# Patient Record
Sex: Female | Born: 1955 | Race: Black or African American | Hispanic: No | Marital: Single | Smoking: Never smoker
Health system: Southern US, Community
[De-identification: ages and names within clinical notes are randomized; demographics above are authoritative.]

## PROBLEM LIST (undated history)

## (undated) ENCOUNTER — Ambulatory Visit: Admission: EM | Payer: Medicare Other | Source: Home / Self Care

## (undated) DIAGNOSIS — E119 Type 2 diabetes mellitus without complications: Secondary | ICD-10-CM

## (undated) DIAGNOSIS — J45909 Unspecified asthma, uncomplicated: Secondary | ICD-10-CM

## (undated) DIAGNOSIS — I10 Essential (primary) hypertension: Secondary | ICD-10-CM

## (undated) DIAGNOSIS — E785 Hyperlipidemia, unspecified: Secondary | ICD-10-CM

## (undated) HISTORY — PX: TUBAL LIGATION: SHX77

## (undated) HISTORY — DX: Unspecified asthma, uncomplicated: J45.909

## (undated) HISTORY — DX: Type 2 diabetes mellitus without complications: E11.9

## (undated) HISTORY — DX: Hyperlipidemia, unspecified: E78.5

## (undated) HISTORY — DX: Essential (primary) hypertension: I10

---

## 2003-03-22 ENCOUNTER — Emergency Department (HOSPITAL_COMMUNITY): Admission: EM | Admit: 2003-03-22 | Discharge: 2003-03-22 | Payer: Self-pay | Admitting: Emergency Medicine

## 2007-07-22 ENCOUNTER — Ambulatory Visit: Payer: Self-pay | Admitting: Internal Medicine

## 2007-07-23 ENCOUNTER — Ambulatory Visit: Payer: Self-pay | Admitting: *Deleted

## 2007-08-05 ENCOUNTER — Ambulatory Visit: Payer: Self-pay | Admitting: Internal Medicine

## 2007-08-05 LAB — CONVERTED CEMR LAB
ALT: 11 units/L (ref 0–35)
BUN: 14 mg/dL (ref 6–23)
Basophils Absolute: 0 10*3/uL (ref 0.0–0.1)
CO2: 26 meq/L (ref 19–32)
Calcium: 9.6 mg/dL (ref 8.4–10.5)
Chloride: 102 meq/L (ref 96–112)
Cholesterol: 178 mg/dL (ref 0–200)
Creatinine, Ser: 0.79 mg/dL (ref 0.40–1.20)
Hemoglobin: 13.2 g/dL (ref 12.0–15.0)
Lymphocytes Relative: 34 % (ref 12–46)
Lymphs Abs: 1.9 10*3/uL (ref 0.7–4.0)
Monocytes Absolute: 0.6 10*3/uL (ref 0.1–1.0)
Monocytes Relative: 11 % (ref 3–12)
Neutro Abs: 3 10*3/uL (ref 1.7–7.7)
RBC: 5.03 M/uL (ref 3.87–5.11)
Total CHOL/HDL Ratio: 3.3

## 2007-08-19 ENCOUNTER — Ambulatory Visit (HOSPITAL_COMMUNITY): Admission: RE | Admit: 2007-08-19 | Discharge: 2007-08-19 | Payer: Self-pay | Admitting: Family Medicine

## 2007-10-03 ENCOUNTER — Encounter: Payer: Self-pay | Admitting: Family Medicine

## 2007-10-03 ENCOUNTER — Ambulatory Visit: Payer: Self-pay | Admitting: Internal Medicine

## 2008-04-06 ENCOUNTER — Emergency Department (HOSPITAL_COMMUNITY): Admission: EM | Admit: 2008-04-06 | Discharge: 2008-04-06 | Payer: Self-pay | Admitting: Emergency Medicine

## 2008-10-15 ENCOUNTER — Emergency Department (HOSPITAL_COMMUNITY): Admission: EM | Admit: 2008-10-15 | Discharge: 2008-10-15 | Payer: Self-pay | Admitting: Emergency Medicine

## 2008-11-26 IMAGING — MG MM DIGITAL SCREENING BILAT W/ CAD
4 series · 4 of 4 positions shown · non-contrast
Comparison: none

DG SCREEN MAMMOGRAM BILATERAL
Bilateral CC and MLO view(s) were taken.

DIGITAL SCREENING MAMMOGRAM WITH CAD:
The breast tissue is heterogeneously dense.  There is no dominant mass, architectural distortion or
calcification to suggest malignancy.

[R CC]
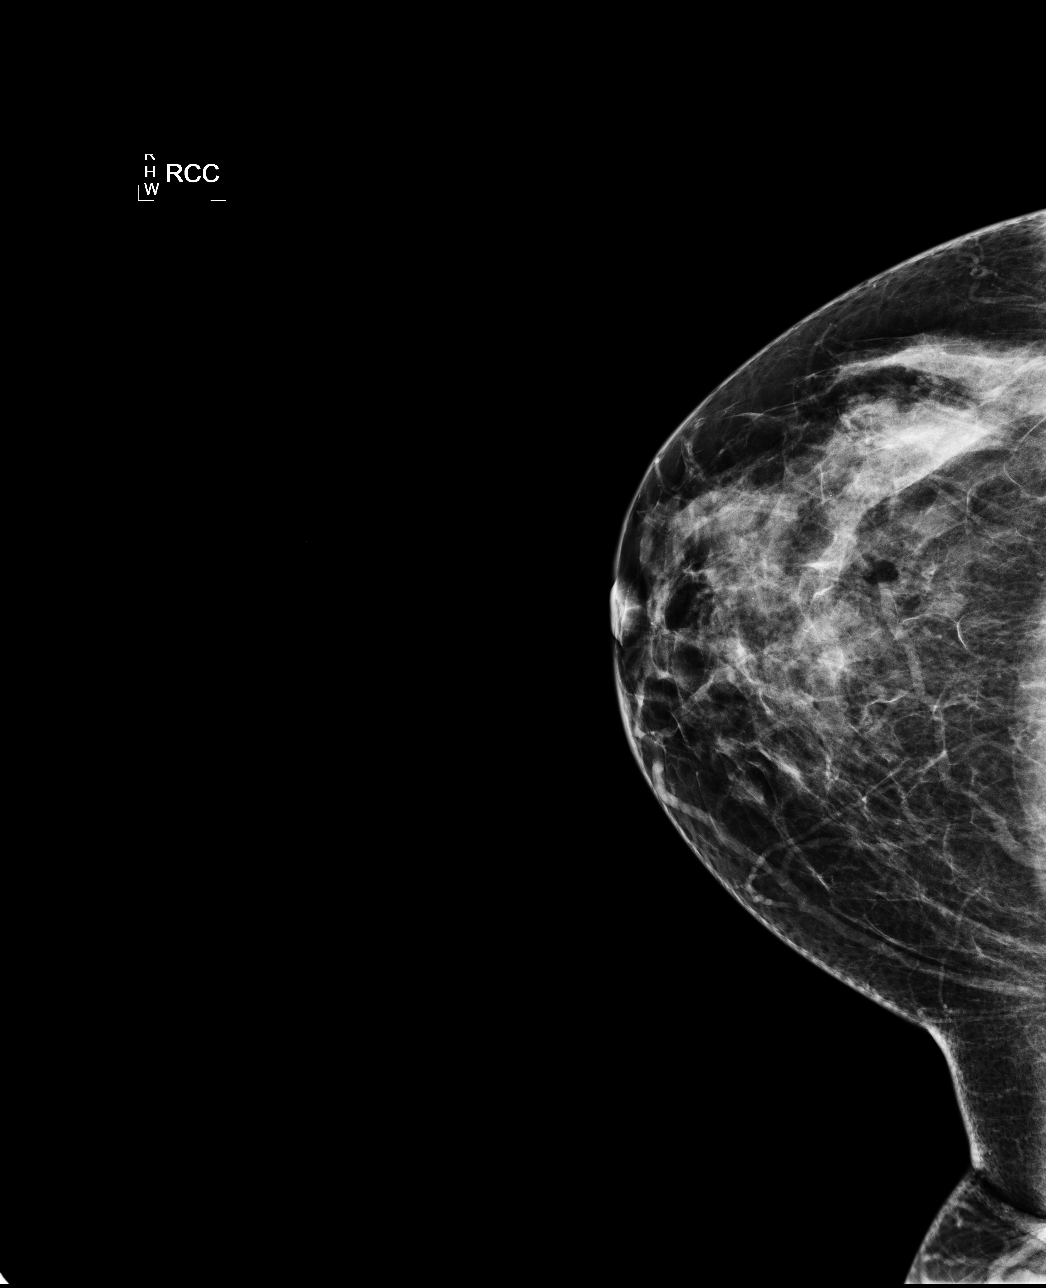

[R MLO]
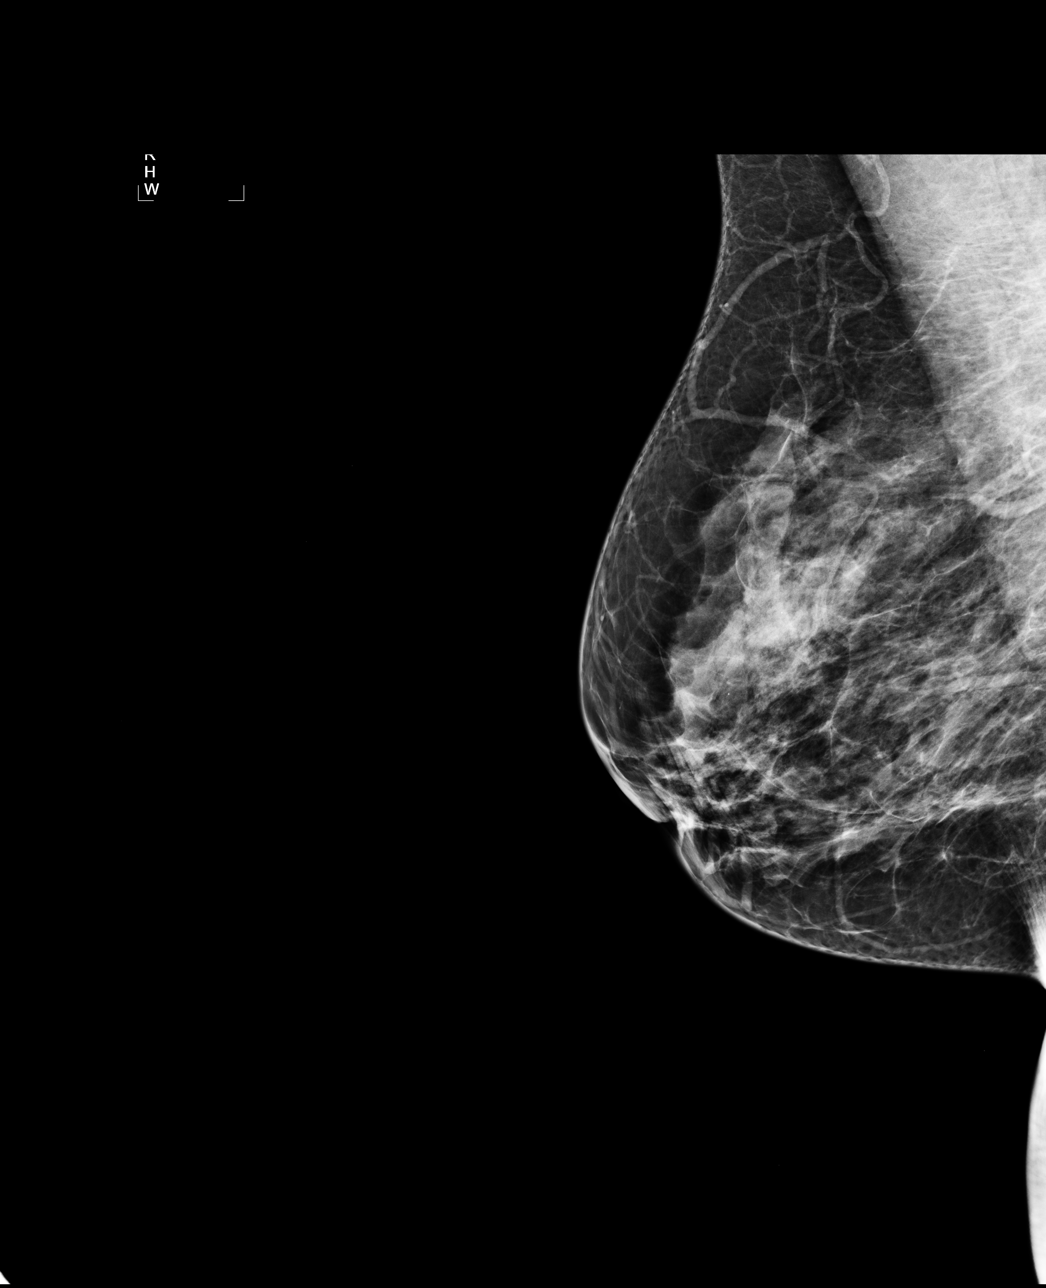

[L CC]
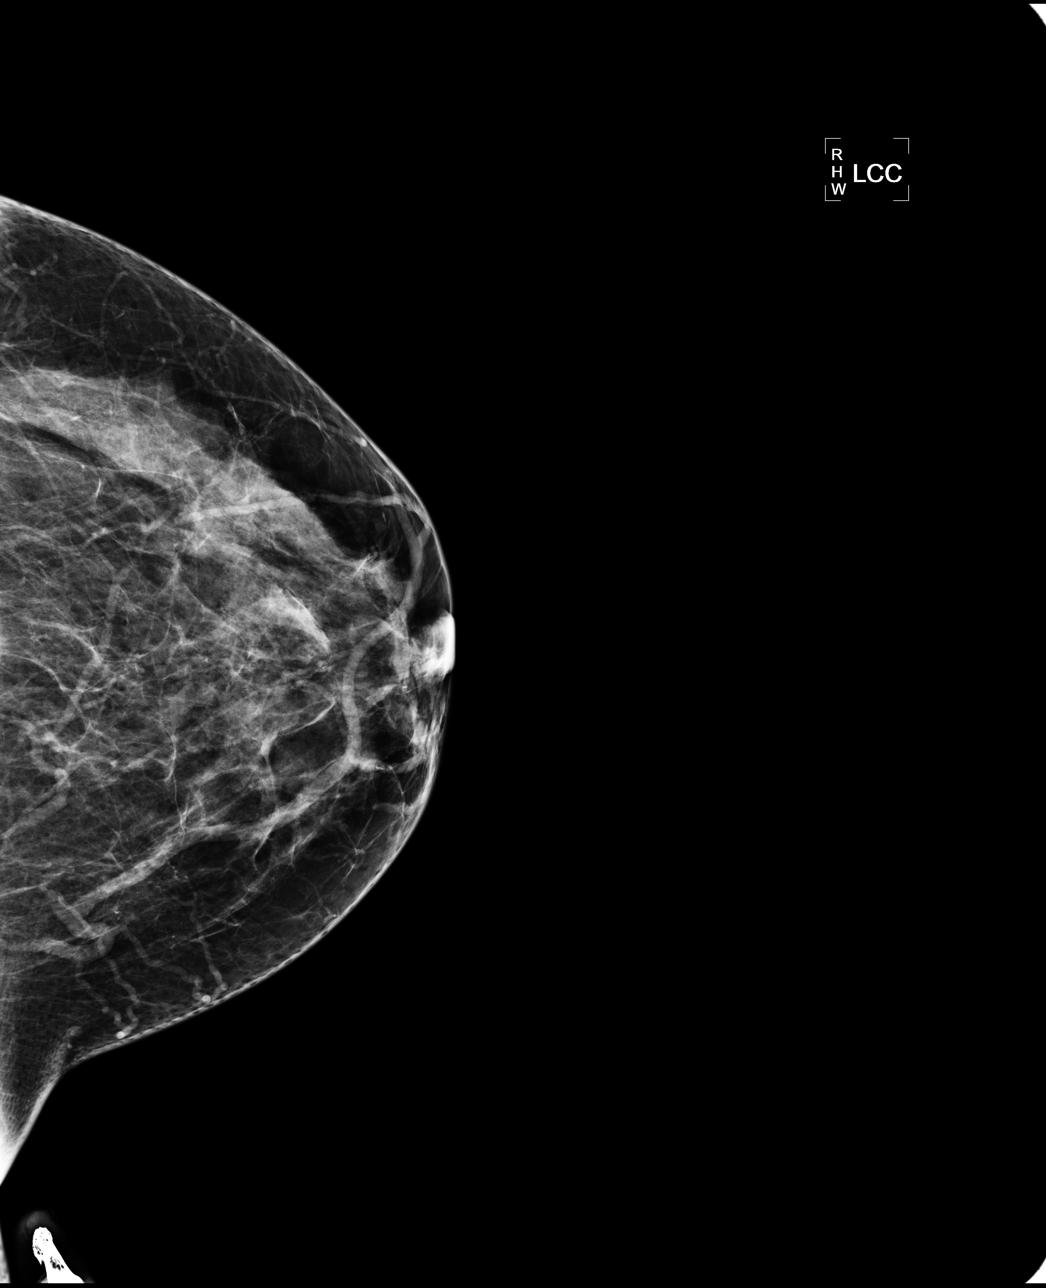

[L MLO]
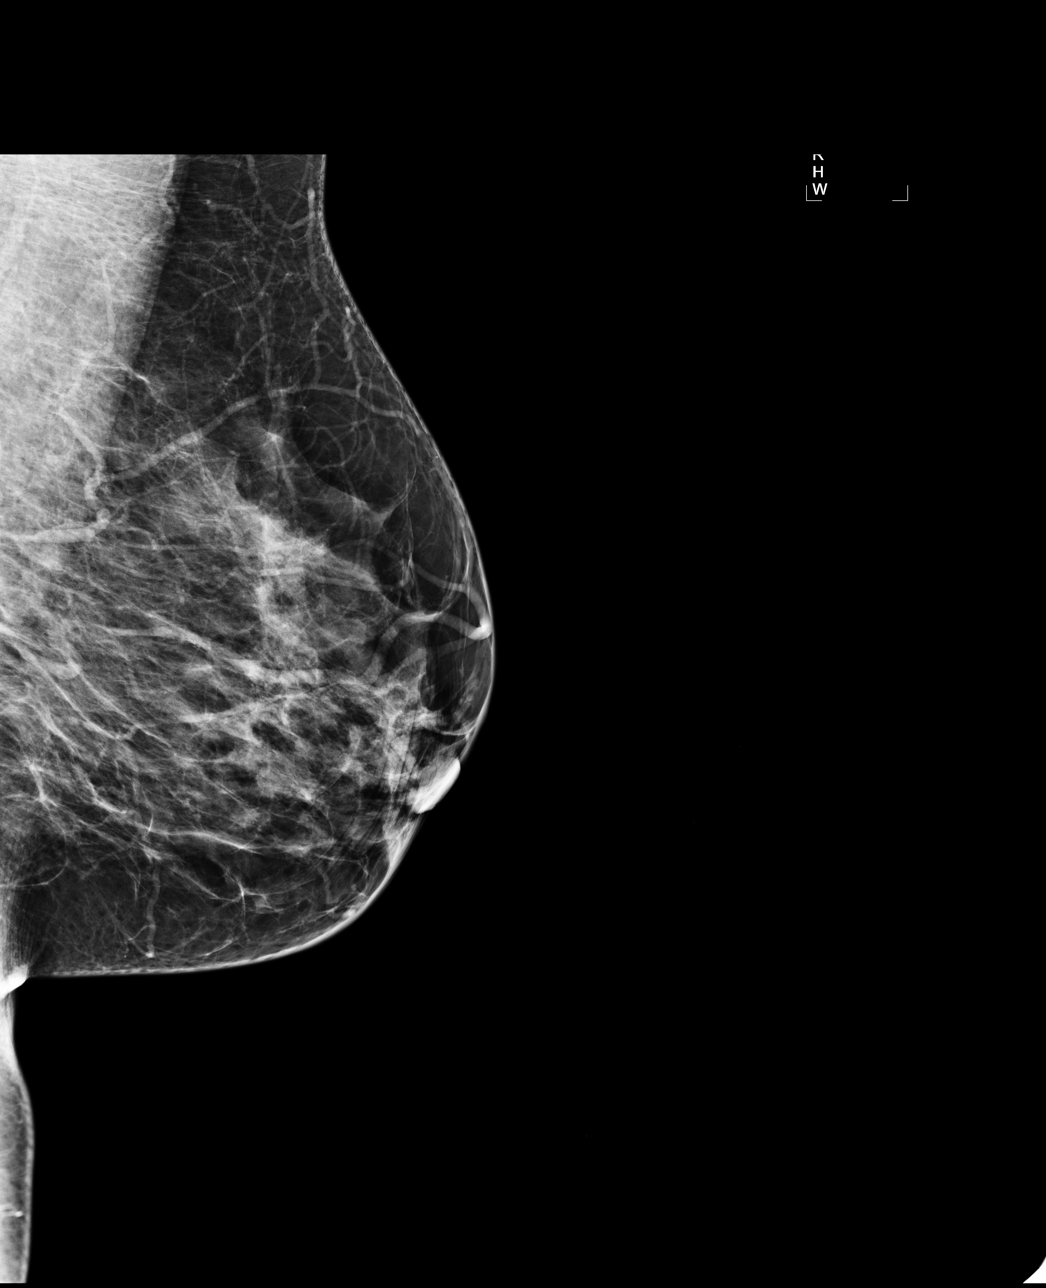

[4 of 4 positions shown; findings below may reference images not displayed]

IMPRESSION: No mammographic evidence of malignancy.  Suggest yearly screening mammography.

ASSESSMENT: Negative - BI-RADS 1

Screening mammogram in 1 year.
ANALYZED BY COMPUTER AIDED DETECTION. , THIS PROCEDURE WAS A DIGITAL MAMMOGRAM.

## 2011-05-06 ENCOUNTER — Emergency Department (HOSPITAL_COMMUNITY): Payer: Self-pay

## 2011-05-06 ENCOUNTER — Emergency Department (HOSPITAL_COMMUNITY)
Admission: EM | Admit: 2011-05-06 | Discharge: 2011-05-06 | Disposition: A | Discharge status: Home / Self Care | Payer: Self-pay | Attending: Emergency Medicine | Admitting: Emergency Medicine

## 2011-05-06 DIAGNOSIS — J40 Bronchitis, not specified as acute or chronic: Secondary | ICD-10-CM | POA: Insufficient documentation

## 2011-05-06 DIAGNOSIS — R0989 Other specified symptoms and signs involving the circulatory and respiratory systems: Secondary | ICD-10-CM | POA: Insufficient documentation

## 2011-05-06 DIAGNOSIS — R509 Fever, unspecified: Secondary | ICD-10-CM | POA: Insufficient documentation

## 2011-05-06 DIAGNOSIS — R0609 Other forms of dyspnea: Secondary | ICD-10-CM | POA: Insufficient documentation

## 2011-05-06 LAB — CBC
Hemoglobin: 11.5 g/dL — ABNORMAL LOW (ref 12.0–15.0)
MCHC: 33 g/dL (ref 30.0–36.0)
RBC: 4.29 MIL/uL (ref 3.87–5.11)
WBC: 6.1 10*3/uL (ref 4.0–10.5)

## 2011-05-06 LAB — BASIC METABOLIC PANEL
BUN: 10 mg/dL (ref 6–23)
Calcium: 9.9 mg/dL (ref 8.4–10.5)
Chloride: 105 mEq/L (ref 96–112)
Creatinine, Ser: 0.67 mg/dL (ref 0.50–1.10)
GFR calc Af Amer: >60 mL/min (ref >60)
GFR calc non Af Amer: >60 mL/min (ref >60)
Sodium: 140 mEq/L (ref 135–145)

## 2011-05-06 LAB — DIFFERENTIAL
Eosinophils Absolute: 0.3 10*3/uL (ref 0.0–0.7)
Lymphs Abs: 2 10*3/uL (ref 0.7–4.0)

## 2011-05-14 ENCOUNTER — Emergency Department (HOSPITAL_COMMUNITY)
Admission: EM | Admit: 2011-05-14 | Discharge: 2011-05-14 | Disposition: A | Discharge status: Home / Self Care | Payer: Self-pay | Attending: Emergency Medicine | Admitting: Emergency Medicine

## 2011-05-14 ENCOUNTER — Emergency Department (HOSPITAL_COMMUNITY): Payer: Self-pay

## 2011-05-14 DIAGNOSIS — R079 Chest pain, unspecified: Secondary | ICD-10-CM | POA: Insufficient documentation

## 2011-05-14 DIAGNOSIS — R05 Cough: Secondary | ICD-10-CM | POA: Insufficient documentation

## 2011-05-14 DIAGNOSIS — R059 Cough, unspecified: Secondary | ICD-10-CM | POA: Insufficient documentation

## 2011-05-14 DIAGNOSIS — J40 Bronchitis, not specified as acute or chronic: Secondary | ICD-10-CM | POA: Insufficient documentation

## 2011-06-15 LAB — URINE MICROSCOPIC-ADD ON

## 2011-06-15 LAB — CBC
HCT: 36.6
Hemoglobin: 12.1
MCV: 75.5 — ABNORMAL LOW
Platelets: 238
RDW: 15.3

## 2011-06-15 LAB — DIFFERENTIAL
Basophils Relative: 1
Eosinophils Absolute: 0.3
Lymphocytes Relative: 31
Monocytes Relative: 10
Neutro Abs: 3.7
Neutrophils Relative %: 54

## 2011-06-15 LAB — COMPREHENSIVE METABOLIC PANEL
AST: 19
CO2: 28
Chloride: 105
GFR calc Af Amer: >60
Potassium: 3.8
Total Bilirubin: 0.6

## 2011-06-15 LAB — URINALYSIS, ROUTINE W REFLEX MICROSCOPIC
Bilirubin Urine: NEGATIVE
Glucose, UA: NEGATIVE
Hgb urine dipstick: NEGATIVE

## 2011-06-15 LAB — POCT PREGNANCY, URINE: Preg Test, Ur: NEGATIVE

## 2012-08-13 IMAGING — CR DG CHEST 2V
2 series · 2 of 2 positions shown · non-contrast
Comparison: None.

CLINICAL DATA: Cough and fever for 1 week.

CHEST - 2 VIEW

[w chest pa]
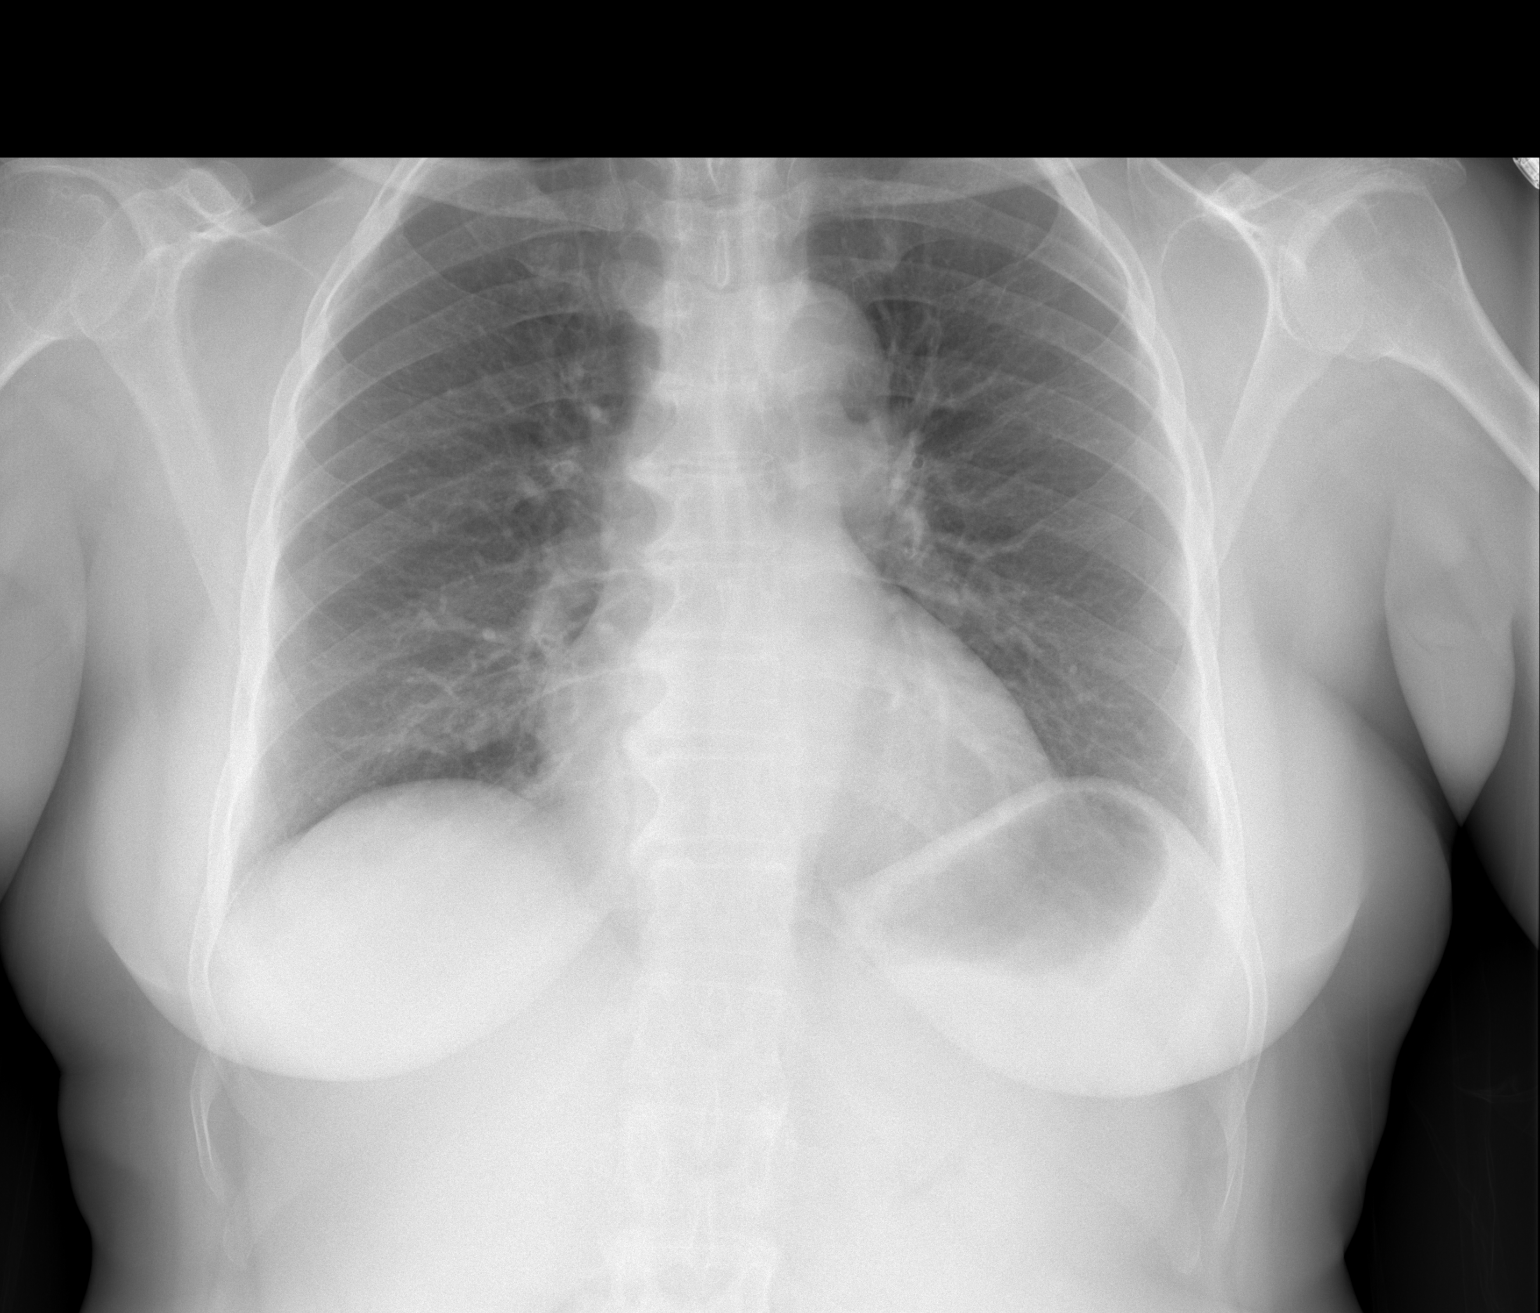

[w chest lat]
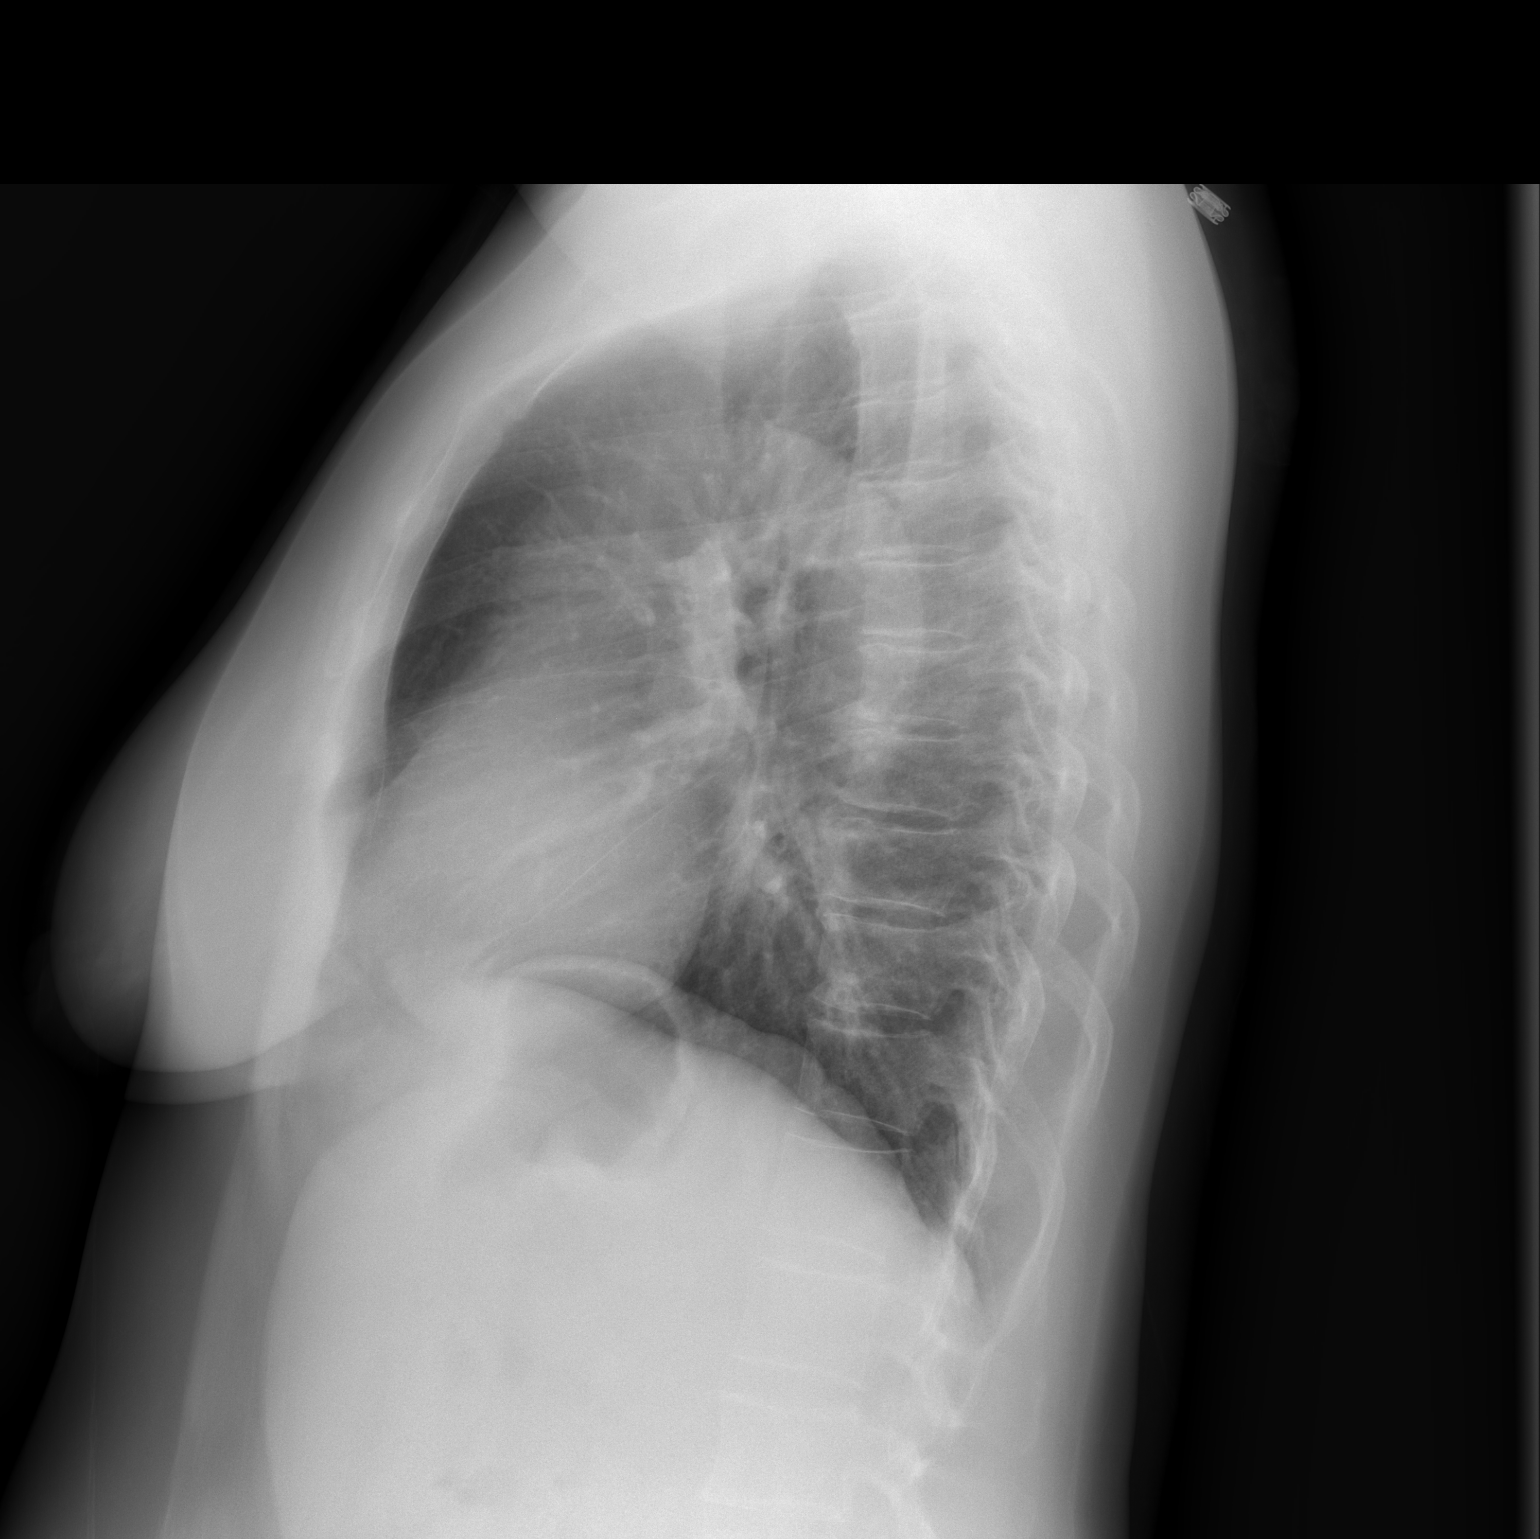

[2 of 2 positions shown; findings below may reference images not displayed]

FINDINGS: Heart size is normal.  There is perihilar peribronchial
thickening.  There are no focal consolidations or pleural
effusions.  No pulmonary edema.  Mild degenerative changes are
identified in the thoracic spine.
IMPRESSION: 1.  Bronchitic changes.
2. No focal pulmonary abnormality.

## 2012-08-21 IMAGING — CR DG CHEST 2V
2 series · 2 of 2 positions shown · non-contrast
Comparison: 05/06/2011.

CLINICAL DATA: Chest pain.

CHEST - 2 VIEW

[w chest lat]
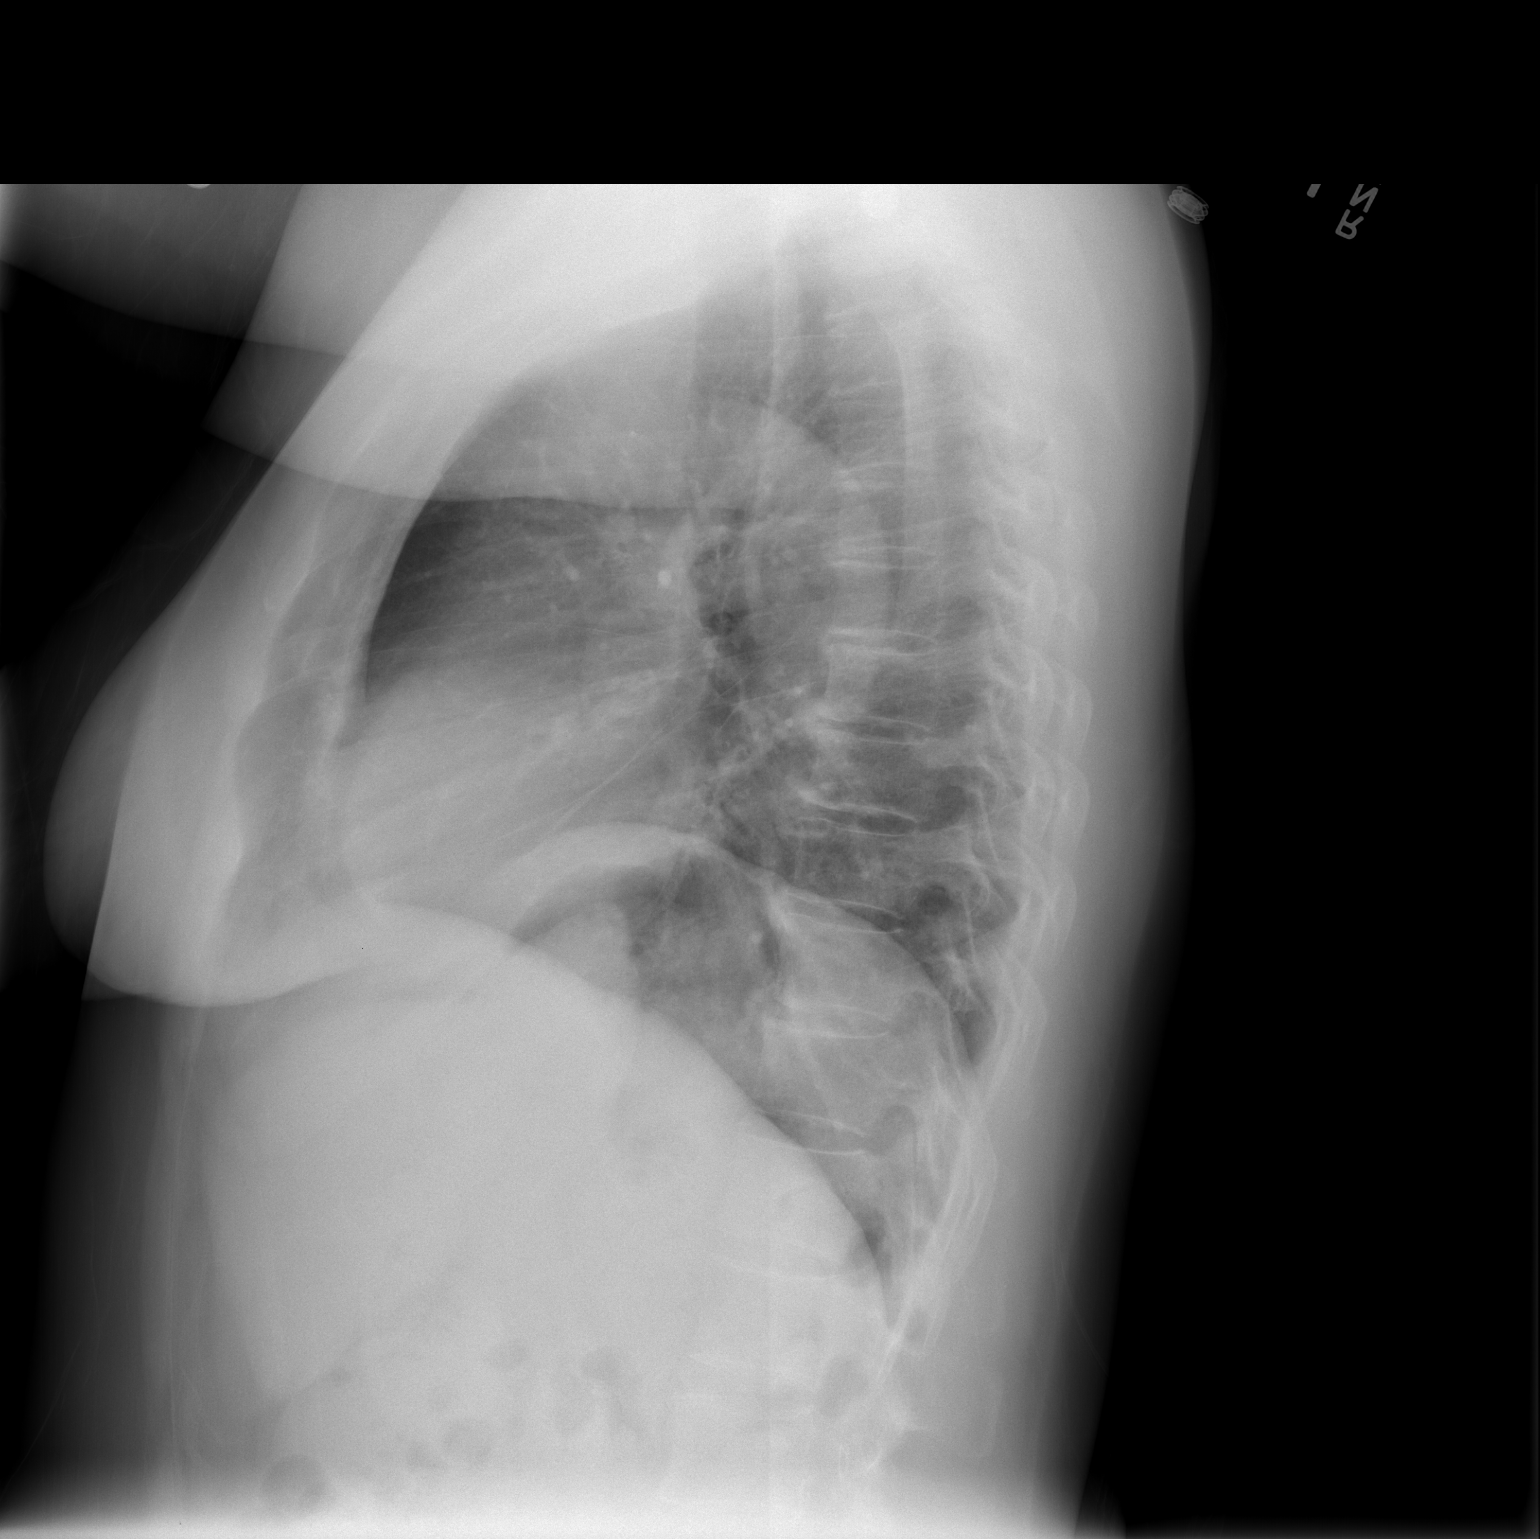

[w chest pa]
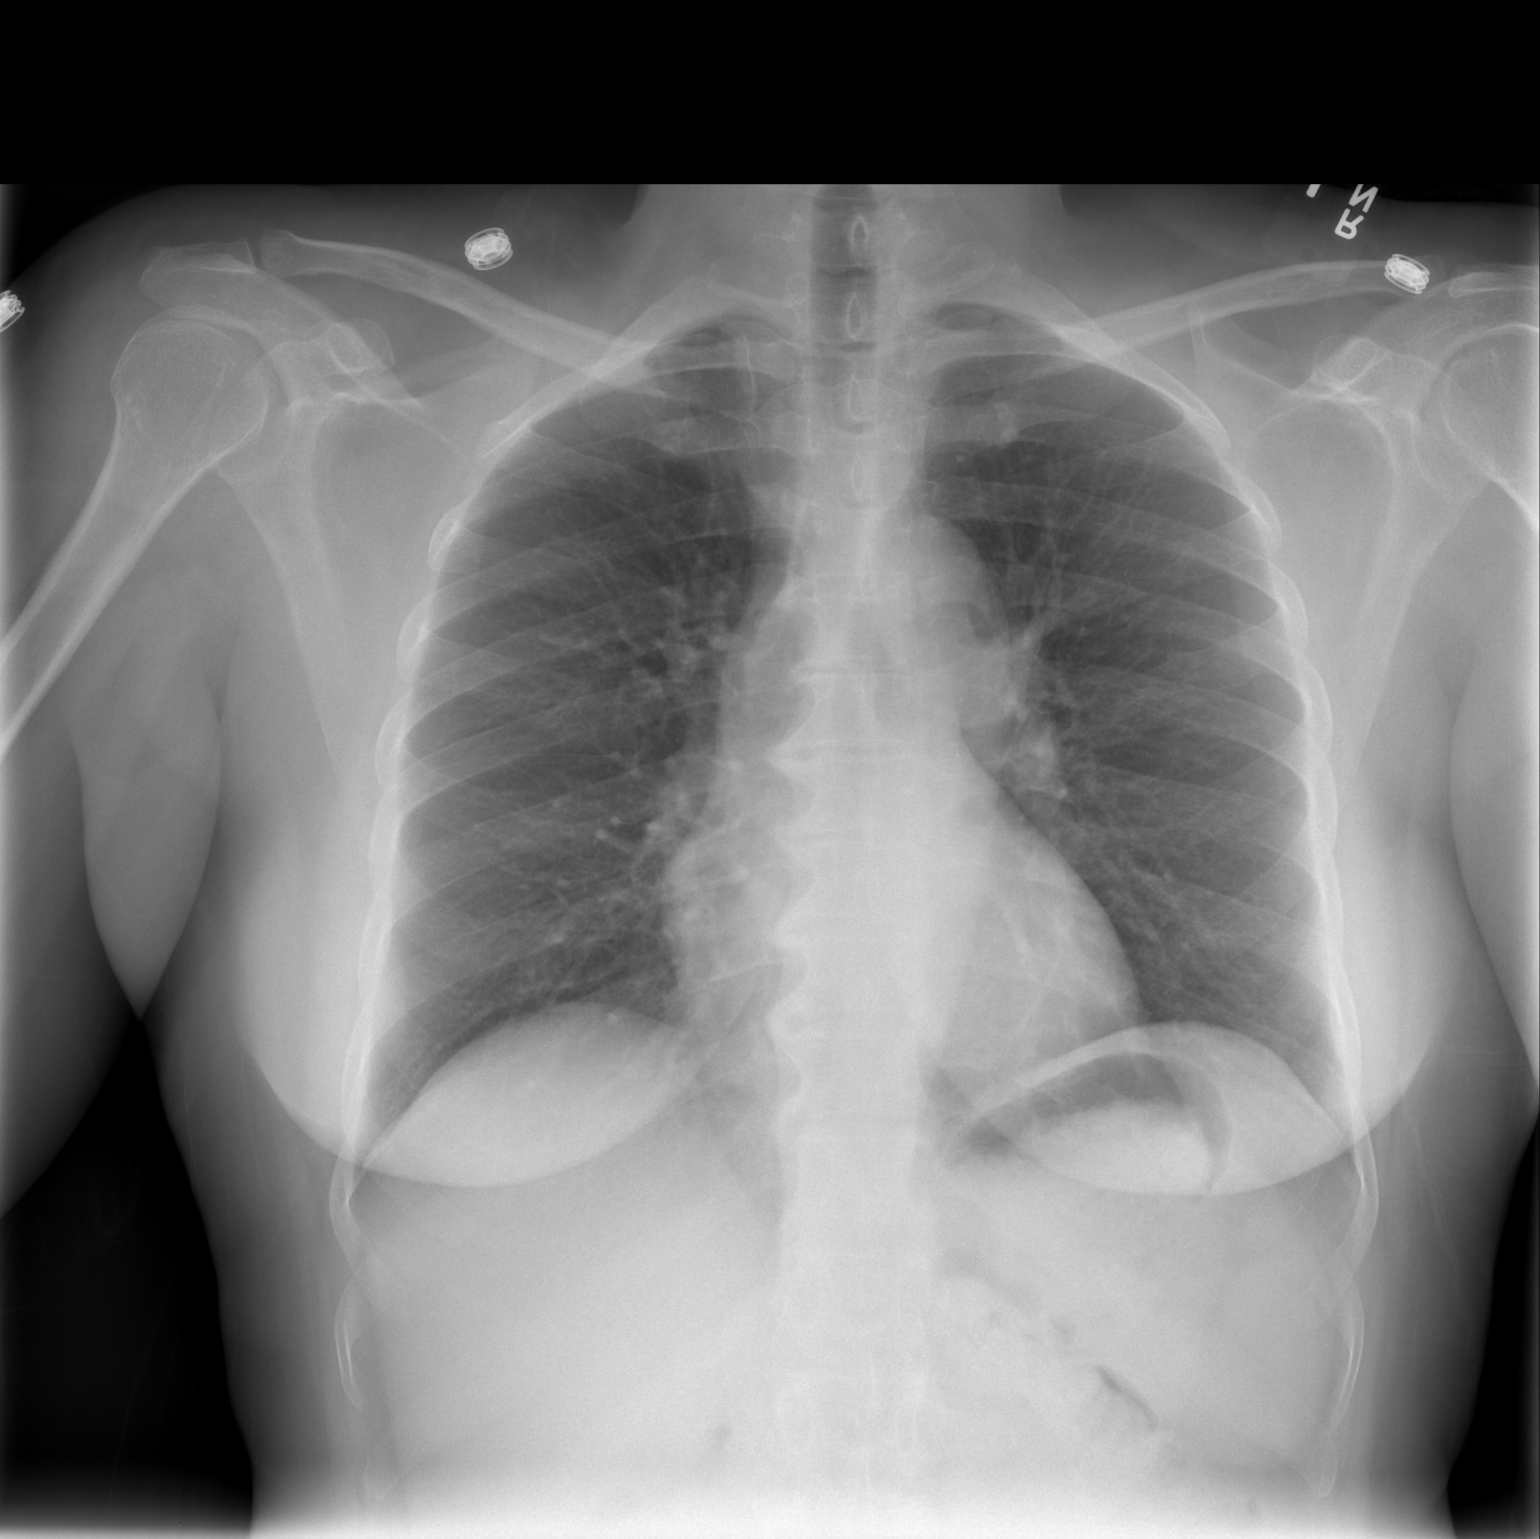

[2 of 2 positions shown; findings below may reference images not displayed]

FINDINGS: Trachea is midline.  Heart size normal.  Lungs are clear.
No pleural fluid.
IMPRESSION: No acute findings.

## 2018-08-29 ENCOUNTER — Institutional Professional Consult (permissible substitution): Payer: Self-pay | Admitting: Internal Medicine

## 2018-09-23 ENCOUNTER — Ambulatory Visit (INDEPENDENT_AMBULATORY_CARE_PROVIDER_SITE_OTHER)
Admission: RE | Admit: 2018-09-23 | Discharge: 2018-09-23 | Disposition: A | Discharge status: Home / Self Care | Payer: BLUE CROSS/BLUE SHIELD | Source: Ambulatory Visit | Attending: Internal Medicine | Admitting: Internal Medicine

## 2018-09-23 ENCOUNTER — Ambulatory Visit (INDEPENDENT_AMBULATORY_CARE_PROVIDER_SITE_OTHER): Payer: BLUE CROSS/BLUE SHIELD | Admitting: Internal Medicine

## 2018-09-23 ENCOUNTER — Encounter: Payer: Self-pay | Admitting: Internal Medicine

## 2018-09-23 DIAGNOSIS — J45991 Cough variant asthma: Secondary | ICD-10-CM | POA: Diagnosis not present

## 2018-09-23 LAB — NITRIC OXIDE: Nitric Oxide: 9

## 2018-09-23 MED ORDER — PREDNISONE 10 MG PO TABS: ORAL_TABLET | ORAL | 0 refills | Status: DC

## 2018-09-23 MED ORDER — FAMOTIDINE 20 MG PO TABS: ORAL_TABLET | ORAL | 11 refills | Status: AC

## 2018-09-23 MED ORDER — BENZONATATE 200 MG PO CAPS: 200.0000 mg | ORAL_CAPSULE | Freq: Three times a day (TID) | ORAL | 1 refills | Status: DC | PRN

## 2018-09-23 MED ORDER — PANTOPRAZOLE SODIUM 40 MG PO TBEC: 40.0000 mg | DELAYED_RELEASE_TABLET | Freq: Every day | ORAL | 2 refills | Status: DC

## 2018-09-23 NOTE — Patient Instructions (Addendum)
Prednisone 10 mg take  4 each am x 2 days,   2 each am x 2 days,  1 each am x 2 days and stop   GERD (REFLUX)  is an extremely common cause of respiratory symptoms just like yours , many times with no obvious heartburn at all.    It can be treated with medication, but also with lifestyle changes including elevation of the head of your bed (ideally with 6 -8inch blocks under the headboard of your bed),  Smoking cessation, avoidance of late meals, excessive alcohol, and avoid fatty foods, chocolate, peppermint, colas, red wine, and acidic juices such as orange juice.  NO MINT OR MENTHOL PRODUCTS SO NO COUGH DROPS  USE SUGARLESS CANDY INSTEAD (Jolley ranchers or Stover's or Life Savers) or even ice chips will also do - the key is to swallow to prevent all throat clearing. NO OIL BASED VITAMINS - use powdered substitutes.  Avoid fish oil when coughing.    For drainage / throat tickle try take CHLORPHENIRAMINE  4 mg = Chlortabs at [facility]-  - available over the counter- may cause drowsiness so start with just a bedtime dose or two and see if eliminates your night time cough   For cough > tessalon 200 mg up to every 8 hours as needed   Please remember to go to the  x-ray department at the [facility] building (directly across from [facility])  @ [address] [place]  in the basement  for your tests - we will call you with the results when they are available    Please schedule a follow up office visit in 4 weeks, sooner if needed  with all medications /inhalers/ solutions in hand so we can verify exactly what you are taking. This includes all medications from all doctors and over the counters Late add : instruction should have also included: Pantoprazole (protonix) 40 mg   Take  30-60 min before first meal of the day and Pepcid (famotidine)  20 mg one @  bedtime until return to office - this is the best way to tell whether stomach acid is contributing to your problem.

## 2018-09-23 NOTE — Progress Notes (Signed)
LMTCB

## 2018-09-23 NOTE — Progress Notes (Signed)
Clarice Poleminata Lawhorn, female    DOB: 10/15/1955,     MRN: [id number]   Brief patient profile:  [age] yobf never smoker  from [place]/ f[place] language/ no longer international travel works as nursing assist  No resp problems until onset of cough summer of 2018 and on and off since then  so referred to pulmonary clinic 09/23/2018 by Dr   Leanor KailBonsu    History of Present Illness  09/23/2018  Pulmonary/ 1st office eval/[facility]  Chief Complaint  Patient presents with  . Pulmonary Consult    Referred by Dr. Julio Sickssei Bonsu.  Pt c/o non prod cough for the past 18 months. Cold, rainy weather makes her cough worse.   Dyspnea:  Only with couging Cough: see below/ mostly dry / really only better when on cough medications like tussionex  Sleep: each noct and each am  SABA use: none     Kouffman Reflux v Neurogenic Cough Differentiator Reflux Comments  Do you awaken from a sound sleep coughing violently?                            With trouble breathing? Yes most nocts   Do you have choking episodes when you cannot  Get enough air, gasping for air ?              sometimes   Do you usually cough when you lie down into  The bed, or when you just lie down to rest ?                          No    Do you usually cough after meals or eating?         Cold water   Do you cough when (or after) you bend over?    no   GERD SCORE     Kouffman Reflux v Neurogenic Cough Differentiator Neurogenic   Do you more-or-less cough all day long? sporadic   Does change of temperature make you cough? yes   Does laughing or chuckling cause you to cough? no   Do fumes (perfume, automobile fumes, burned  Toast, etc.,) cause you to cough ?      perfume   Does speaking, singing, or talking on the phone cause you to cough   ?               None    Neurogenic/Airway score      No obvious other pattern in  day to day or daytime variability or assoc excess/ purulent sputum or mucus plugs or hemoptysis or cp or chest tightness, subjective  wheeze or overt sinus or hb symptoms.    Also denies any obvious fluctuation of symptoms with weather or environmental changes or other aggravating or alleviating factors except as outlined above   No unusual exposure hx or h/o childhood pna/ asthma or knowledge of premature birth.  Current Allergies, Complete Past Medical History, Past Surgical History, Family History, and Social History were reviewed in [facility] electronic medical record.  ROS  The following are not active complaints unless bolded Hoarseness, sore throat, dysphagia, dental problems, itching, sneezing,  nasal congestion or discharge of excess mucus or purulent secretions, ear ache,   fever, chills, sweats, unintended wt loss or wt gain, classically pleuritic or exertional cp,  orthopnea pnd or arm/hand swelling  or leg swelling, presyncope, palpitations, abdominal pain, anorexia, nausea, vomiting, diarrhea  or change in bowel habits or change in bladder habits, change in stools or change in urine, dysuria, hematuria,  rash, arthralgias, visual complaints, headache, numbness, weakness or ataxia or problems with walking or coordination,  change in mood or  memory.           No past medical history on file.  Outpatient Medications Prior to Visit  Medication Sig Dispense Refill  . amLODipine (NORVASC) 5 MG tablet Take 5 mg by mouth daily.    . rosuvastatin (CRESTOR) 20 MG tablet Take 20 mg by mouth daily.         Objective:     BP 118/68 (BP Location: Left Arm, Cuff Size: Normal)   Pulse (!) 51   Ht 5\' 6"  (1.676 m)   Wt 184 lb (83.5 kg)   SpO2 99%   BMI 29.70 kg/m   SpO2: 99 % RA  Very pleasant slt hoarse amb bf nad   HEENT: nl dentition, turbinates bilaterally, and oropharynx. Nl external ear canals without cough reflex   NECK :  without JVD/Nodes/TM/ nl carotid upstrokes bilaterally   LUNGS: no acc muscle use,  Nl contour chest which is clear to A and P bilaterally without cough on insp or exp  maneuvers   CV:  RRR  no s3 or murmur or increase in P2, and no edema   ABD:  soft and nontender with nl inspiratory excursion in the supine position. No bruits or organomegaly appreciated, bowel sounds nl  MS:  Nl gait/ ext warm without deformities, calf tenderness, cyanosis or clubbing No obvious joint restrictions   SKIN: warm and dry without lesions    NEURO:  alert, approp, nl sensorium with  no motor or cerebellar deficits apparent.      CXR PA and Lateral:   09/23/2018 :    I personally reviewed images and agree with radiology impression as follows:    1.  Cardiomegaly.  Normal pulmonary vascularity. 2.  No acute pulmonary disease. My review:  Min cardiomegaly      Assessment   Cough variant asthma vs UACS Onset summer of 2018  - Spirometry 09/23/2018  FEV1 2.2 (97%)  Ratio nl s curvature or prior rx - FENO 09/23/2018  =   9 - max rx for gerd and 1st gen H1 blockers per guidelines  For UACS 09/23/2018 >>>    The most common causes of chronic cough in immunocompetent adults include the following: upper airway cough syndrome (UACS), previously referred to as postnasal drip syndrome (PNDS), which is caused by variety of rhinosinus conditions; (2) asthma; (3) GERD; (4) chronic bronchitis from cigarette smoking or other inhaled environmental irritants; (5) nonasthmatic eosinophilic bronchitis; and (6) bronchiectasis.   These conditions, singly or in combination, have accounted for up to 94% of the causes of chronic cough in prospective studies.   Other conditions have constituted no >6% of the causes in prospective studies These have included bronchogenic carcinoma, chronic interstitial pneumonia, sarcoidosis, left ventricular failure, ACEI-induced cough, and aspiration from a condition associated with pharyngeal dysfunction.    Chronic cough is often simultaneously caused by more than one condition. A single cause has been found from 38 to 82% of the time, multiple causes from 18  to 62%. Multiply caused cough has been the result of three diseases up to 42% of the time.       Based on absence of assoc rhinitis/atopic hx and nl spirometry /feno,  Over asthma here I favor Upper airway cough syndrome (  previously labeled PNDS),  is so named because it's frequently impossible to sort out how much is  CR/sinusitis with freq throat clearing (which can be related to primary GERD)   vs  causing  secondary (" extra esophageal")  GERD from wide swings in gastric pressure that occur with throat clearing, often  promoting self use of mint and menthol lozenges that reduce the lower esophageal sphincter tone and exacerbate the problem further in a cyclical fashion.   These are the same pts (now being labeled as having "irritable larynx syndrome" by some cough centers) who not infrequently have a history of having failed to tolerate ace inhibitors,  dry powder inhalers or biphosphonates or report having atypical/extraesophageal reflux symptoms that don't respond to standard doses of PPI  and are easily confused as having aecopd or asthma flares by even experienced allergists/ pulmonologists (myself included).    Of the three most common causes of  Sub-acute / recurrent or chronic cough, only one (GERD)  can actually contribute to/ trigger  the other two (asthma and post nasal drip syndrome)  and perpetuate the cylce of cough.  While not intuitively obvious, many patients with chronic low grade reflux do not cough until there is a primary insult that disturbs the protective epithelial barrier and exposes sensitive nerve endings.   This is typically viral but can due to PNDS and  either may apply here.     >>>  The point is that once this occurs, it is difficult to eliminate the cycle  using anything but a maximally effective acid suppression regimen at least in the short run, accompanied by an appropriate diet to address non acid GERD and control / eliminate the cough itself with high doses of  tessalon (avoiding tussionex or opioids in general if possible) and eliminate pnds, esp at hs, with 1st gen H1 blockers per guidelines   Also given short course of prednisone for any T2 driven inflammatory component and if only transiently responds then may need allergy profile or even methacholine challenge to sort out.   Also reviewed: The standardized cough guidelines published in Chest by [place] in 2006 are still the best available and consist of a multiple step process (up to 12!) , not a single office visit,  and are intended  to address this problem logically,  with an alogrithm dependent on response to empiric treatment at  each progressive step  to determine a specific diagnosis with  minimal addtional testing needed. Therefore if adherence is an issue or can't be accurately verified,  it's very unlikely the standard evaluation and treatment will be successful here.    Furthermore, response to therapy (other than acute cough suppression, which should only be used short term with avoidance of narcotic containing cough syrups if possible), can be a gradual process for which the patient is not likely to  perceive immediate benefit.  Unlike going to an eye doctor where the best perscription is almost always the first one and is immediately effective, this is almost never the case in the management of chronic cough syndromes. Therefore the patient needs to commit up front to consistently adhere to recommendations  for up to 4-6 weeks of therapy directed at the likely underlying problem(s) before the response can be reasonably evaluated.   F/u in 4 weeks with all meds in hand using a trust but verify approach to confirm accurate Medication  Reconciliation The principal here is that until we are certain that the  patients are  doing what we've asked, it makes no sense to ask them to do more.        Total time devoted to counseling  > 50 % of initial 60 min office visit:  review case with pt/  discussion of options/alternatives/ personally creating written customized instructions  in presence of pt  then going over those specific  Instructions directly with the pt including how to use all of the meds but in particular covering each new medication in detail and the difference between the maintenance= "automatic" meds and the prns using an action plan format for the latter (If this problem/symptom => do that organization reading Left to right).  Please see AVS from this visit for a full list of these instructions which I personally wrote for this pt and  are unique to this visit.        Sandrea Hughs, MD 09/23/2018

## 2018-09-24 ENCOUNTER — Telehealth: Payer: Self-pay | Admitting: Internal Medicine

## 2018-09-24 ENCOUNTER — Telehealth: Payer: Self-pay | Admitting: *Deleted

## 2018-09-24 ENCOUNTER — Encounter: Payer: Self-pay | Admitting: Internal Medicine

## 2018-09-24 NOTE — Telephone Encounter (Signed)
-----   Message from Nyoka Cowden, MD sent at 09/24/2018  6:27 AM EST ----- Late add : instruction should have also included: Pantoprazole (protonix) 40 mg   Take  30-60 min before first meal of the day and Pepcid (famotidine)  20 mg one @  bedtime until return to office - this is the best way to tell whether stomach acid is contributing to your problem.  They were included on list of meds on her avs and called in already

## 2018-09-24 NOTE — Assessment & Plan Note (Signed)
Onset summer of 2018  - Spirometry 09/23/2018  FEV1 2.2 (97%)  Ratio nl s curvature or prior rx - FENO 09/23/2018  =   9 - max rx for gerd and 1st gen H1 blockers per guidelines  For UACS 09/23/2018 >>>   The most common causes of chronic cough in immunocompetent adults include the following: upper airway cough syndrome (UACS), previously referred to as postnasal drip syndrome (PNDS), which is caused by variety of rhinosinus conditions; (2) asthma; (3) GERD; (4) chronic bronchitis from cigarette smoking or other inhaled environmental irritants; (5) nonasthmatic eosinophilic bronchitis; and (6) bronchiectasis.   These conditions, singly or in combination, have accounted for up to 94% of the causes of chronic cough in prospective studies.   Other conditions have constituted no >6% of the causes in prospective studies These have included bronchogenic carcinoma, chronic interstitial pneumonia, sarcoidosis, left ventricular failure, ACEI-induced cough, and aspiration from a condition associated with pharyngeal dysfunction.    Chronic cough is often simultaneously caused by more than one condition. A single cause has been found from 38 to 82% of the time, multiple causes from 18 to 62%. Multiply caused cough has been the result of three diseases up to 42% of the time.       Based on absence of assoc rhinitis/atopic hx and nl spirometry /feno,  Over asthma here I favor Upper airway cough syndrome (previously labeled PNDS),  is so named because it's frequently impossible to sort out how much is  CR/sinusitis with freq throat clearing (which can be related to primary GERD)   vs  causing  secondary (" extra esophageal")  GERD from wide swings in gastric pressure that occur with throat clearing, often  promoting self use of mint and menthol lozenges that reduce the lower esophageal sphincter tone and exacerbate the problem further in a cyclical fashion.   These are the same pts (now being labeled as having  "irritable larynx syndrome" by some cough centers) who not infrequently have a history of having failed to tolerate ace inhibitors,  dry powder inhalers or biphosphonates or report having atypical/extraesophageal reflux symptoms that don't respond to standard doses of PPI  and are easily confused as having aecopd or asthma flares by even experienced allergists/ pulmonologists (myself included).    Of the three most common causes of  Sub-acute / recurrent or chronic cough, only one (GERD)  can actually contribute to/ trigger  the other two (asthma and post nasal drip syndrome)  and perpetuate the cylce of cough.  While not intuitively obvious, many patients with chronic low grade reflux do not cough until there is a primary insult that disturbs the protective epithelial barrier and exposes sensitive nerve endings.   This is typically viral but can due to PNDS and  either may apply here.     >>>  The point is that once this occurs, it is difficult to eliminate the cycle  using anything but a maximally effective acid suppression regimen at least in the short run, accompanied by an appropriate diet to address non acid GERD and control / eliminate the cough itself with high doses of tessalon (avoiding tussionex or opioids in general if possible) and eliminate pnds, esp at hs, with 1st gen H1 blockers per guidelines   Also given short course of prednisone for any T2 driven inflammatory component and if only transiently responds then may need allergy profile or even methacholine challenge to sort out.   Also reviewed: The standardized cough guidelines published  in Chest by [place] in 2006 are still the best available and consist of a multiple step process (up to 12!) , not a single office visit,  and are intended  to address this problem logically,  with an alogrithm dependent on response to empiric treatment at  each progressive step  to determine a specific diagnosis with  minimal addtional testing  needed. Therefore if adherence is an issue or can't be accurately verified,  it's very unlikely the standard evaluation and treatment will be successful here.    Furthermore, response to therapy (other than acute cough suppression, which should only be used short term with avoidance of narcotic containing cough syrups if possible), can be a gradual process for which the patient is not likely to  perceive immediate benefit.  Unlike going to an eye doctor where the best perscription is almost always the first one and is immediately effective, this is almost never the case in the management of chronic cough syndromes. Therefore the patient needs to commit up front to consistently adhere to recommendations  for up to 4-6 weeks of therapy directed at the likely underlying problem(s) before the response can be reasonably evaluated.   F/u in 4 weeks with all meds in hand using a trust but verify approach to confirm accurate Medication  Reconciliation The principal here is that until we are certain that the  patients are doing what we've asked, it makes no sense to ask them to do more.    Total time devoted to counseling  > 50 % of initial 60 min office visit:  review case with pt/ discussion of options/alternatives/ personally creating written customized instructions  in presence of pt  then going over those specific  Instructions directly with the pt including how to use all of the meds but in particular covering each new medication in detail and the difference between the maintenance= "automatic" meds and the prns using an action plan format for the latter (If this problem/symptom => do that organization reading Left to right).  Please see AVS from this visit for a full list of these instructions which I personally wrote for this pt and  are unique to this visit.

## 2018-09-24 NOTE — Telephone Encounter (Signed)
LMTCB.  Notes recorded by Nyoka Cowden, MD on 09/23/2018 at 5:15 PM EST Call pt: Reviewed cxr and no acute change so no change in recommendations made at ov.

## 2018-09-24 NOTE — Telephone Encounter (Signed)
ATC and phone rings multiple times and then msg states call can not be completed  WCB later

## 2018-09-25 NOTE — Telephone Encounter (Signed)
ATC, NA and no option to leave msg 

## 2018-09-25 NOTE — Telephone Encounter (Signed)
Attempted to call Patient.  Left message on VM to call back. 

## 2018-09-25 NOTE — Telephone Encounter (Signed)
ATC pt, no answer. Left message for pt to call back.  

## 2018-09-25 NOTE — Progress Notes (Signed)
ATC, NA and no VM available this time

## 2018-09-25 NOTE — Telephone Encounter (Signed)
Patient returning call, CB is [phone] °

## 2018-09-25 NOTE — Telephone Encounter (Signed)
Patient returning call, CB is [phone]

## 2018-09-26 NOTE — Telephone Encounter (Signed)
Pt notified of recs

## 2018-09-26 NOTE — Telephone Encounter (Signed)
Called and spoke with pt letting her know the results of cxr. Pt expressed understanding. Nothing further needed. 

## 2018-10-14 ENCOUNTER — Other Ambulatory Visit: Payer: Self-pay | Admitting: Internal Medicine

## 2018-10-14 DIAGNOSIS — J45991 Cough variant asthma: Secondary | ICD-10-CM

## 2018-10-19 ENCOUNTER — Other Ambulatory Visit: Payer: Self-pay | Admitting: Internal Medicine

## 2018-10-19 DIAGNOSIS — J45991 Cough variant asthma: Secondary | ICD-10-CM

## 2018-10-21 ENCOUNTER — Encounter: Payer: Self-pay | Admitting: Internal Medicine

## 2018-10-21 ENCOUNTER — Ambulatory Visit: Payer: BLUE CROSS/BLUE SHIELD | Admitting: Internal Medicine

## 2018-10-21 VITALS — BP 122/68 | HR 75 | Ht 66.0 in | Wt 183.0 lb

## 2018-10-21 DIAGNOSIS — J45991 Cough variant asthma: Secondary | ICD-10-CM | POA: Diagnosis not present

## 2018-10-21 NOTE — Patient Instructions (Addendum)
Pantoprazole (protonix) 40 mg   Take  30-60 min before first meal of the day and Pepcid (famotidine)  20 mg one @  bedtime until return to office - this is the best way to tell whether stomach acid is contributing to your problem.      GERD (REFLUX)  is an extremely common cause of respiratory symptoms just like yours , many times with no obvious heartburn at all.    It can be treated with medication, but also with lifestyle changes including elevation of the head of your bed (ideally with 6 -8inch blocks under the headboard of your bed),  Smoking cessation, avoidance of late meals, excessive alcohol, and avoid fatty foods, chocolate, peppermint, colas, red wine, and acidic juices such as orange juice.  NO MINT OR MENTHOL PRODUCTS SO NO COUGH DROPS  USE SUGARLESS CANDY INSTEAD (Jolley ranchers or Stover's or Life Savers) or even ice chips will also do - the key is to swallow to prevent all throat clearing. NO OIL BASED VITAMINS - use powdered substitutes.  Avoid fish oil when coughing.    For drainage / throat tickle try take CHLORPHENIRAMINE  4 mg = Chlortabs at [facility]-  - available over the counter- may cause drowsiness so start with just a bedtime dose or two and see if eliminates your night time cough   For cough > tessalon 200 mg up to every 8 hours as needed    Please schedule a follow up office visit in 4 weeks, sooner if needed

## 2018-10-21 NOTE — Progress Notes (Signed)
Gabriella Norman, female    DOB: 08/27/1956,     MRN: [id number]   Brief patient profile:  36 yobf never smoker  from [place]/ f[place] Primary language/ no longer international travel works as nursing assist  No resp problems until onset of cough summer of 2018 and on and off since then  so referred to pulmonary clinic 09/23/2018 by Dr   Leanor Kail    History of Present Illness  09/23/2018  Pulmonary/ 1st office eval/Alvilda Mckenna  Chief Complaint  Patient presents with  . Pulmonary Consult    Referred by Dr. Julio Sicks.  Pt c/o non prod cough for the past 18 months. Cold, rainy weather makes her cough worse.   Dyspnea:  Only with couging Cough: see below/ mostly dry / really only better when on cough medications like tussionex  Sleep: each noct and each am  SABA use: none rec Prednisone 10 mg take  4 each am x 2 days,   2 each am x 2 days,  1 each am x 2 days and stop  GERD diet  For drainage / throat tickle try take CHLORPHENIRAMINE  4 mg = Chlortabs at [facility]-    For cough > tessalon 200 mg up to every 8 hours as needed  Please remember to go to the  x-ray department    Pantoprazole (protonix) 40 mg   Take  30-60 min before first meal of the day and Pepcid (famotidine)  20 mg one @  bedtime until return to office - this is the best way to tell whether stomach acid is contributing to your problem.     10/21/2018  f/u ov/Irving Lubbers re: chronic cough/ did not try 1st gen H1 blockers per guidelines   Chief Complaint  Patient presents with  . Follow-up    Cough     Dyspnea:  Not limited by breathing from desired activities   Cough: first thing am but overall better  Sleeping: fine flat  SABA use: none  02: none    No obvious day to day or daytime variability or assoc excess/ purulent sputum or mucus plugs or hemoptysis or cp or chest tightness, subjective wheeze or overt sinus or hb symptoms.   Sleeping without nocturnal   exacerbation  of respiratory  c/o's or need for noct saba. Also denies any obvious  fluctuation of symptoms with weather or environmental changes or other aggravating or alleviating factors except as outlined above   No unusual exposure hx or h/o childhood pna/ asthma or knowledge of premature birth.  Current Allergies, Complete Past Medical History, Past Surgical History, Family History, and Social History were reviewed in [employer] record.  ROS  The following are not active complaints unless bolded Hoarseness, sore throat, dysphagia, dental problems, itching, sneezing,  nasal congestion or discharge of excess mucus or purulent secretions, ear ache,   fever, chills, sweats, unintended wt loss or wt gain, classically pleuritic or exertional cp,  orthopnea pnd or arm/hand swelling  or leg swelling, presyncope, palpitations, abdominal pain, anorexia, nausea, vomiting, diarrhea  or change in bowel habits or change in bladder habits, change in stools or change in urine, dysuria, hematuria,  rash, arthralgias, visual complaints, headache, numbness, weakness or ataxia or problems with walking or coordination,  change in mood or  memory.        Current Meds  Medication Sig  . amLODipine (NORVASC) 5 MG tablet Take 5 mg by mouth daily.  . benzonatate (TESSALON) 200 MG capsule Take 1  capsule (200 mg total) by mouth 3 (three) times daily as needed for cough.  . famotidine (PEPCID) 20 MG tablet One at bedtime  . pantoprazole (PROTONIX) 40 MG tablet Take 1 tablet (40 mg total) by mouth daily. Take 30-60 min before first meal of the day  . rosuvastatin (CRESTOR) 20 MG tablet Take 20 mg by mouth daily.           Objective:     Pleasant amb black female/ no longer hoarse  Wt Readings from Last 3 Encounters:  10/21/18 183 lb (83 kg)  09/23/18 184 lb (83.5 kg)     Vital signs reviewed - Note on arrival 02 sats  98% on RA      HEENT: nl dentition, turbinates bilaterally, and oropharynx. Nl external ear canals without cough reflex   NECK :  without  JVD/Nodes/TM/ nl carotid upstrokes bilaterally   LUNGS: no acc muscle use,  Nl contour chest which is clear to A and P bilaterally without cough on insp or exp maneuvers   CV:  RRR  no s3 or murmur or increase in P2, and no edema   ABD:  soft and nontender with nl inspiratory excursion in the supine position. No bruits or organomegaly appreciated, bowel sounds nl  MS:  Nl gait/ ext warm without deformities, calf tenderness, cyanosis or clubbing No obvious joint restrictions   SKIN: warm and dry without lesions    NEURO:  alert, approp, nl sensorium with  no motor or cerebellar deficits apparent.             Assessment

## 2018-10-22 ENCOUNTER — Encounter: Payer: Self-pay | Admitting: Internal Medicine

## 2018-10-22 NOTE — Assessment & Plan Note (Addendum)
Onset summer of 2018  - Spirometry 09/23/2018  FEV1 2.2 (97%)  Ratio nl s curvature or prior rx - FENO 09/23/2018  =   9 - max rx for gerd and 1st gen H1 blockers per guidelines  For UACS 09/23/2018 >>> did not take 1st gen H1 blockers per guidelines    rec 1st gen H1 blockers per guidelines  / continue gerd rx / diet/ hob elevation reviewed  Each maintenance medication was reviewed in detail including most importantly the difference between maintenance and as needed and under what circumstances the prns are to be used.  Please see AVS for specific  Instructions which are unique to this visit and I personally typed out  which were reviewed in detail in writing with the patient and a copy provided.    F/u in 4 weeks, sooner prn

## 2018-11-18 ENCOUNTER — Ambulatory Visit: Payer: BLUE CROSS/BLUE SHIELD | Admitting: Internal Medicine

## 2018-12-17 ENCOUNTER — Other Ambulatory Visit: Payer: Self-pay | Admitting: Internal Medicine

## 2018-12-17 DIAGNOSIS — J45991 Cough variant asthma: Secondary | ICD-10-CM

## 2019-01-26 ENCOUNTER — Other Ambulatory Visit: Payer: Self-pay | Admitting: Internal Medicine

## 2019-01-26 DIAGNOSIS — J45991 Cough variant asthma: Secondary | ICD-10-CM

## 2019-01-29 ENCOUNTER — Other Ambulatory Visit: Payer: Self-pay | Admitting: Internal Medicine

## 2019-01-29 DIAGNOSIS — J45991 Cough variant asthma: Secondary | ICD-10-CM

## 2019-04-17 DIAGNOSIS — I10 Essential (primary) hypertension: Secondary | ICD-10-CM | POA: Insufficient documentation

## 2019-04-23 DIAGNOSIS — E119 Type 2 diabetes mellitus without complications: Secondary | ICD-10-CM | POA: Insufficient documentation

## 2019-04-23 DIAGNOSIS — E7800 Pure hypercholesterolemia, unspecified: Secondary | ICD-10-CM | POA: Insufficient documentation

## 2019-11-18 DIAGNOSIS — E876 Hypokalemia: Secondary | ICD-10-CM | POA: Insufficient documentation

## 2020-03-06 ENCOUNTER — Other Ambulatory Visit: Payer: Self-pay

## 2020-03-06 ENCOUNTER — Encounter (HOSPITAL_COMMUNITY): Payer: Self-pay

## 2020-03-06 ENCOUNTER — Emergency Department (HOSPITAL_COMMUNITY)
Admission: EM | Admit: 2020-03-06 | Discharge: 2020-03-06 | Disposition: A | Discharge status: Home / Self Care | Payer: BLUE CROSS/BLUE SHIELD | Attending: Emergency Medicine | Admitting: Emergency Medicine

## 2020-03-06 DIAGNOSIS — Z7984 Long term (current) use of oral hypoglycemic drugs: Secondary | ICD-10-CM | POA: Insufficient documentation

## 2020-03-06 DIAGNOSIS — B029 Zoster without complications: Secondary | ICD-10-CM | POA: Diagnosis not present

## 2020-03-06 DIAGNOSIS — M7918 Myalgia, other site: Secondary | ICD-10-CM | POA: Diagnosis not present

## 2020-03-06 DIAGNOSIS — R21 Rash and other nonspecific skin eruption: Secondary | ICD-10-CM | POA: Diagnosis present

## 2020-03-06 MED ORDER — VALACYCLOVIR HCL 1 G PO TABS
1000.0000 mg | ORAL_TABLET | Freq: Three times a day (TID) | ORAL | 0 refills | Status: AC
Start: 2020-03-06 — End: 2020-03-13

## 2020-03-06 MED ORDER — HYDROCODONE-ACETAMINOPHEN 5-325 MG PO TABS
1.0000 | ORAL_TABLET | Freq: Once | ORAL | Status: AC
  Administered 2020-03-06: 1 via ORAL
  Filled 2020-03-06: qty 1

## 2020-03-06 MED ORDER — HYDROCODONE-ACETAMINOPHEN 5-325 MG PO TABS: 2.0000 | ORAL_TABLET | ORAL | 0 refills | Status: DC | PRN

## 2020-03-06 MED ORDER — VALACYCLOVIR HCL 500 MG PO TABS
1000.0000 mg | ORAL_TABLET | Freq: Once | ORAL | Status: AC
  Administered 2020-03-06: 1000 mg via ORAL
  Filled 2020-03-06: qty 2

## 2020-03-06 NOTE — ED Provider Notes (Signed)
[facility]-EMERGENCY DEPT Provider Note   CSN: [id number] Arrival date & time: 03/06/20  0104     History No chief complaint on file.   Gabriella Norman is a 64 y.o. female presenting for evaluation of rash and right leg pain.  Patient states 4 days ago she developed generalized body aches.  The next day she woke up with a rash on her right leg.  She thought this was from using muscle creams on her body and then taking a shower the next morning.  Rash has been persistent over the past 4 days, is extremely painful.  Rashes only on her right leg.  She reports a deep/muscle pain of her right leg.  She denies fall, trauma, or injury.  She has taken a dose of amoxicillin without improvement of symptoms.  She has been taking Aleve twice a day without improvement of symptoms.  She has not tried anything else.  She denies sick contacts.  She does not know if she is ever had chickenpox.  She reports a history of high blood pressure, hyperlipidemia, and prediabetes for which she is on Metformin.  No other medical problems.  She denies fevers, chills, chest pain, shortness breath, cough, nausea, vomiting, abdominal pain.  HPI     History reviewed. No pertinent past medical history.  Patient Active Problem List   Diagnosis Date Noted  . 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 No pertinent surgical history.   OB History   No obstetric history on file.     No family history on file.  Social History   Tobacco Use  . Smoking status: Never Smoker  . Smokeless tobacco: Never Used  Vaping Use  . Vaping Use: Never used  Substance Use Topics  . Alcohol use: Never  . Drug use: Never    Home Medications Prior to Admission medications   Medication Sig Start Date End Date Taking? Authorizing Provider  amLODipine (NORVASC) 5 MG tablet Take 5 mg by mouth daily.    [provider]  atenolol-chlorthalidone (TENORETIC) 50-25 MG tablet  10/14/18    [provider]  benzonatate (TESSALON) 200 MG capsule Take 1 capsule by mouth three times daily as needed for cough 01/30/19   Nyoka Cowden, MD  famotidine (PEPCID) 20 MG tablet One at bedtime 09/23/18   Nyoka Cowden, MD  HYDROcodone-acetaminophen (NORCO/VICODIN) 5-325 MG tablet Take 2 tablets by mouth every 4 (four) hours as needed. 03/06/20   Vielka Klinedinst, PA-C  metFORMIN (GLUCOPHAGE) 500 MG tablet TAKE 1 TABLET BY MOUTH TWICE DAILY FOR 90 DAYS 07/17/18   [provider]  pantoprazole (PROTONIX) 40 MG tablet TAKE 1 TABLET BY MOUTH ONCE DAILY(TAKE 30-60 MINUTES BEFORE FIRST MEAL OF THE DAY) 01/27/19   Nyoka Cowden, MD  rosuvastatin (CRESTOR) 20 MG tablet Take 20 mg by mouth daily.    [provider]  valACYclovir (VALTREX) 1000 MG tablet Take 1 tablet (1,000 mg total) by mouth 3 (three) times daily for 7 days. 03/06/20 03/13/20  Gunner Iodice, PA-C    Allergies    Patient has no known allergies.  Review of Systems   Review of Systems  Musculoskeletal: Positive for myalgias.  Skin: Positive for rash.  All other systems reviewed and are negative.   Physical Exam Updated Vital Signs BP 138/62 (BP Location: Right Arm)   Pulse 85   Temp 98.7 F (37.1 C) (Oral)   Resp 16   SpO2 98%  Physical Exam Vitals and nursing note reviewed.  Constitutional:      General: She is not in acute distress.    Appearance: She is well-developed.     Comments: Appears uncomfortable due to pain, otherwise nontoxic  HENT:     Head: Normocephalic and atraumatic.  Cardiovascular:     Rate and Rhythm: Normal rate and regular rhythm.  Pulmonary:     Effort: Pulmonary effort is normal.     Breath sounds: Normal breath sounds.  Abdominal:     General: There is no distension.     Palpations: There is no mass.     Tenderness: There is no abdominal tenderness. There is no guarding or rebound.  Musculoskeletal:        General: Normal range of motion.     Cervical  back: Normal range of motion.  Skin:    General: Skin is warm.     Capillary Refill: Capillary refill takes less than 2 seconds.     Findings: Rash present.     Comments: Vesicular rash noted along the entire distribution of L5 starting at the apex of the gluteal cleft ending at the base of the right foot.  See pictures below.  Neurological:     Mental Status: She is alert and oriented to person, place, and time.            ED Results / Procedures / Treatments   Labs (all labs ordered are listed, but only abnormal results are displayed) Labs Reviewed - No data to display  EKG None  Radiology No results found.  Procedures Procedures (including critical care time)  Medications Ordered in ED Medications  HYDROcodone-acetaminophen (NORCO/VICODIN) 5-325 MG per tablet 1 tablet (1 tablet Oral Given 03/06/20 0248)  valACYclovir (VALTREX) tablet 1,000 mg (1,000 mg Oral Given 03/06/20 0248)    ED Course  I have reviewed the triage vital signs and the nursing notes.  Pertinent labs & imaging results that were available during my care of the patient were reviewed by me and considered in my medical decision making (see chart for details).    MDM Rules/Calculators/A&P                          Patient presenting for evaluation of painful rash of the right leg.  On exam, this extends along the L5 dermatome.  It is a vesicular rash, consistent with shingles.  Discussed findings with patient.  Discussed treatment with antiviral and pain control.  Encouraged follow-up with primary care if symptoms not improving.  Discussed rash is contagious, and importance of avoiding elderly, immunocompromise, and pregnant people.  At this time, there is no sign of infection, will not cover with antibiotics.  At this time, patient appears safe for discharge.  Return pecautions given.  Patient states she understands and agrees to plan.  Final Clinical Impression(s) / ED Diagnoses Final diagnoses:    Herpes zoster without complication    Rx / DC Orders ED Discharge Orders         Ordered    HYDROcodone-acetaminophen (NORCO/VICODIN) 5-325 MG tablet  Every 4 hours PRN     Discontinue  Reprint     03/06/20 0235    valACYclovir (VALTREX) 1000 MG tablet  3 times daily     Discontinue  Reprint     03/06/20 0235           Franchot Heidelberg, PA-C 03/06/20 2595    Rolland Porter, MD 03/06/20  0547  

## 2020-03-06 NOTE — ED Triage Notes (Signed)
Arrived by EMS from home. Patient reports she used a muscle rub, then got into shower with cream on right leg. Patient also reports taking an unprescribed Amoxicillin that she got from a friend, thinking it would help the rash.

## 2020-03-06 NOTE — Discharge Instructions (Signed)
Take valacyclovir 3 times a day for the next week.  Continue taking aleve 2 times a day with meals.  Do not take other anti-inflammatories at the same time (Advil, Motrin, naproxen, ibuprofen). You may supplement with Tylenol if you need further pain control. Use norco as needed for severe or breakthrough pain. Have caution, this may make you tired or groggy. Do not drive or operate heavy machinery while taking this medicine.  You will need to stay out of work until the rash has crusted over.  Follow up with your primary care doctor as needed for further evaluation.  Return to the ER if you develop fevers, inability to walk, or any new, worsening, or concerning symptoms.

## 2020-10-21 ENCOUNTER — Other Ambulatory Visit: Payer: Self-pay

## 2020-10-21 ENCOUNTER — Other Ambulatory Visit: Payer: 59

## 2020-10-21 DIAGNOSIS — Z20822 Contact with and (suspected) exposure to covid-19: Secondary | ICD-10-CM

## 2020-10-22 LAB — SARS-COV-2, NAA 2 DAY TAT

## 2020-10-22 LAB — NOVEL CORONAVIRUS, NAA: SARS-CoV-2, NAA: NOT DETECTED

## 2021-01-03 ENCOUNTER — Other Ambulatory Visit: Payer: Self-pay | Admitting: Internal Medicine

## 2021-01-03 ENCOUNTER — Ambulatory Visit
Admission: RE | Admit: 2021-01-03 | Discharge: 2021-01-03 | Disposition: A | Discharge status: Home / Self Care | Payer: 59 | Source: Ambulatory Visit | Attending: Internal Medicine | Admitting: Internal Medicine

## 2021-01-03 DIAGNOSIS — M25512 Pain in left shoulder: Secondary | ICD-10-CM

## 2021-02-15 ENCOUNTER — Other Ambulatory Visit: Payer: Self-pay | Admitting: Internal Medicine

## 2021-02-15 DIAGNOSIS — S0990XD Unspecified injury of head, subsequent encounter: Secondary | ICD-10-CM

## 2021-03-01 ENCOUNTER — Ambulatory Visit
Admission: RE | Admit: 2021-03-01 | Discharge: 2021-03-01 | Disposition: A | Discharge status: Home / Self Care | Payer: 59 | Source: Ambulatory Visit | Attending: Internal Medicine | Admitting: Internal Medicine

## 2021-03-01 DIAGNOSIS — S0990XD Unspecified injury of head, subsequent encounter: Secondary | ICD-10-CM

## 2021-04-03 ENCOUNTER — Other Ambulatory Visit: Payer: Self-pay

## 2021-04-03 ENCOUNTER — Encounter: Payer: Self-pay | Admitting: Physician Assistant

## 2021-04-03 ENCOUNTER — Ambulatory Visit (INDEPENDENT_AMBULATORY_CARE_PROVIDER_SITE_OTHER): Payer: 59 | Admitting: Physician Assistant

## 2021-04-03 VITALS — BP 146/79 | HR 87 | Ht 66.0 in | Wt 187.2 lb

## 2021-04-03 DIAGNOSIS — R413 Other amnesia: Secondary | ICD-10-CM | POA: Insufficient documentation

## 2021-04-03 NOTE — Patient Instructions (Addendum)
It was a pleasure to see you today at our office.   Recommendations:  Neurocognitive evaluation at our office  Check labs today  Follow up once the results of the above are available   RECOMMENDATIONS FOR ALL PATIENTS WITH MEMORY PROBLEMS: 1. Continue to exercise (Recommend 30 minutes of walking everyday, or 3 hours every week) 2. Increase social interactions - continue going to [place] and enjoy social gatherings with friends and family 3. Eat healthy, avoid fried foods and eat more fruits and vegetables 4. Maintain adequate blood pressure, blood sugar, and blood cholesterol level. Reducing the risk of stroke and cardiovascular disease also helps promoting better memory. 5. Avoid stressful situations. Live a simple life and avoid aggravations. Organize your time and prepare for the next day in anticipation. 6. Sleep well, avoid any interruptions of sleep and avoid any distractions in the bedroom that may interfere with adequate sleep quality 7. Avoid sugar, avoid sweets as there is a strong link between excessive sugar intake, diabetes, and cognitive impairment We discussed the Mediterranean diet, which has been shown to help patients reduce the risk of progressive memory disorders and reduces cardiovascular risk. This includes eating fish, eat fruits and green leafy vegetables, nuts like almonds and hazelnuts, walnuts, and also use olive oil. Avoid fast foods and fried foods as much as possible. Avoid sweets and sugar as sugar use has been linked to worsening of memory function.  There is always a concern of gradual progression of memory problems. If this is the case, then we may need to adjust level of care according to patient needs. Support, both to the patient and caregiver, should then be put into place.      You have been referred for a neuropsychological evaluation (i.e., evaluation of memory and thinking abilities). Please bring someone with you to this appointment if possible, as it  is helpful for the doctor to hear from both you and another adult who knows you well. Please bring eyeglasses and hearing aids if you wear them.    The evaluation will take approximately 3 hours and has two parts:   The first part is a clinical interview with the neuropsychologist (Dr. Milbert Coulter or Dr. Roseanne Reno). During the interview, the neuropsychologist will speak with you and the individual you brought to the appointment.    The second part of the evaluation is testing with the doctor's technician Annabelle Harman or Selena Batten). During the testing, the technician will ask you to remember different types of material, solve problems, and answer some questionnaires. Your family member will not be present for this portion of the evaluation.   Please note: We must reserve several hours of the neuropsychologist's time and the psychometrician's time for your evaluation appointment. As such, there is a No-Show fee of $100. If you are unable to attend any of your appointments, please contact our office as soon as possible to reschedule.    FALL PRECAUTIONS: Be cautious when walking. Scan the area for obstacles that may increase the risk of trips and falls. When getting up in the mornings, sit up at the edge of the bed for a few minutes before getting out of bed. Consider elevating the bed at the head end to avoid drop of blood pressure when getting up. Walk always in a well-lit room (use night lights in the walls). Avoid area rugs or power cords from appliances in the middle of the walkways. Use a walker or a cane if necessary and consider physical therapy for balance exercise.  Get your eyesight checked regularly.  FINANCIAL OVERSIGHT: Supervision, especially oversight when making financial decisions or transactions is also recommended.  HOME SAFETY: Consider the safety of the kitchen when operating appliances like stoves, microwave oven, and blender. Consider having supervision and share cooking responsibilities until no longer  able to participate in those. Accidents with firearms and other hazards in the house should be identified and addressed as well.   ABILITY TO BE LEFT ALONE: If patient is unable to contact 911 operator, consider using LifeLine, or when the need is there, arrange for someone to stay with patients. Smoking is a fire hazard, consider supervision or cessation. Risk of wandering should be assessed by caregiver and if detected at any point, supervision and safe proof recommendations should be instituted.  MEDICATION SUPERVISION: Inability to self-administer medication needs to be constantly addressed. Implement a mechanism to ensure safe administration of the medications.   DRIVING: Regarding driving, in patients with progressive memory problems, driving will be impaired. We advise to have someone else do the driving if trouble finding directions or if minor accidents are reported. Independent driving assessment is available to determine safety of driving.   If you are interested in the driving assessment, you can contact the following:  [employer] in [place]  [place] [phone] or [phone]    [place] refers to food and lifestyle choices that are based on the traditions of countries located on the [employer]. This way of eating has been shown to help prevent certain conditions and improve outcomes for people who have chronic diseases, like kidney disease and heart disease. What are tips for following this plan? Lifestyle  Cook and eat meals together with your family, when possible. Drink enough fluid to keep your urine clear or pale yellow. Be physically active every day. This includes: Aerobic exercise like running or swimming. Leisure activities like gardening, walking, or housework. Get 7-8 hours of sleep each night. If  recommended by your health care provider, drink red wine in moderation. This means 1 glass a day for nonpregnant women and 2 glasses a day for men. A glass of wine equals 5 oz (150 mL). Reading food labels  Check the serving size of packaged foods. For foods such as rice and pasta, the serving size refers to the amount of cooked product, not dry. Check the total fat in packaged foods. Avoid foods that have saturated fat or trans fats. Check the ingredients list for added sugars, such as corn syrup. Shopping  At the grocery store, buy most of your food from the areas near the walls of the store. This includes: Fresh fruits and vegetables (produce). Grains, beans, nuts, and seeds. Some of these may be available in unpackaged forms or large amounts (in bulk). Fresh seafood. Poultry and eggs. Low-fat dairy products. Buy whole ingredients instead of prepackaged foods. Buy fresh fruits and vegetables in-season from local farmers markets. Buy frozen fruits and vegetables in resealable bags. If you do not have access to quality fresh seafood, buy precooked frozen shrimp or canned fish, such as tuna, salmon, or sardines. Buy small amounts of raw or cooked vegetables, salads, or olives from the deli or salad bar at your store. Stock your pantry so you always have certain foods on hand, such as olive oil, canned tuna, canned tomatoes, rice, pasta, and beans. Cooking  Cook foods with extra-virgin olive oil instead of using butter  or other vegetable oils. Have meat as a side dish, and have vegetables or grains as your main dish. This means having meat in small portions or adding small amounts of meat to foods like pasta or stew. Use beans or vegetables instead of meat in common dishes like chili or lasagna. Experiment with different cooking methods. Try roasting or broiling vegetables instead of steaming or sauteing them. Add frozen vegetables to soups, stews, pasta, or rice. Add nuts or seeds for added  healthy fat at each meal. You can add these to yogurt, salads, or vegetable dishes. Marinate fish or vegetables using olive oil, lemon juice, garlic, and fresh herbs. Meal planning  Plan to eat 1 vegetarian meal one day each week. Try to work up to 2 vegetarian meals, if possible. Eat seafood 2 or more times a week. Have healthy snacks readily available, such as: Vegetable sticks with hummus. Greek yogurt. Fruit and nut trail mix. Eat balanced meals throughout the week. This includes: Fruit: 2-3 servings a day Vegetables: 4-5 servings a day Low-fat dairy: 2 servings a day Fish, poultry, or lean meat: 1 serving a day Beans and legumes: 2 or more servings a week Nuts and seeds: 1-2 servings a day Whole grains: 6-8 servings a day Extra-virgin olive oil: 3-4 servings a day Limit red meat and sweets to only a few servings a month What are my food choices? Mediterranean diet Recommended Grains: Whole-grain pasta. Brown rice. Bulgar wheat. Polenta. Couscous. Whole-wheat bread. [place]. Vegetables: Artichokes. Beets. Broccoli. Cabbage. Carrots. Eggplant. Green beans. Chard. Kale. Spinach. Onions. Leeks. Peas. Squash. Tomatoes. Peppers. Radishes. Fruits: Apples. Apricots. Avocado. Berries. Bananas. Cherries. Dates. Figs. Grapes. Lemons. Melon. Oranges. Peaches. Plums. Pomegranate. Meats and other protein foods: Beans. Almonds. Sunflower seeds. Pine nuts. Peanuts. [place]. Salmon. Scallops. Shrimp. [place]. Tilapia. Clams. Oysters. Eggs. Dairy: Low-fat milk. Cheese. Greek yogurt. Beverages: Water. Red wine. Herbal tea. Fats and oils: Extra virgin olive oil. Avocado oil. Grape seed oil. Sweets and desserts: [place] yogurt with honey. Baked apples. Poached pears. Trail mix. Seasoning and other foods: Basil. Cilantro. Coriander. Cumin. Mint. Parsley. Sage. Rosemary. Tarragon. Garlic. Oregano. Thyme. Pepper. Balsalmic vinegar. Tahini. Hummus. Tomato sauce. Olives. Mushrooms. Limit these Grains:  Prepackaged pasta or rice dishes. Prepackaged cereal with added sugar. Vegetables: Deep fried potatoes (french fries). Fruits: Fruit canned in syrup. Meats and other protein foods: Beef. Pork. Lamb. Poultry with skin. Hot dogs. Berniece Salines. Dairy: Ice cream. Sour cream. Whole milk. Beverages: Juice. Sugar-sweetened soft drinks. Beer. Liquor and spirits. Fats and oils: Butter. Canola oil. Vegetable oil. Beef fat (tallow). Lard. Sweets and desserts: Cookies. Cakes. Pies. Candy. Seasoning and other foods: Mayonnaise. Premade sauces and marinades. The items listed may not be a complete list. Talk with your dietitian about what dietary choices are right for you. Summary The Mediterranean diet includes both food and lifestyle choices. Eat a variety of fresh fruits and vegetables, beans, nuts, seeds, and whole grains. Limit the amount of red meat and sweets that you eat. Talk with your health care provider about whether it is safe for you to drink red wine in moderation. This means 1 glass a day for nonpregnant women and 2 glasses a day for men. A glass of wine equals 5 oz (150 mL). This information is not intended to replace advice given to you by your health care provider. Make sure you discuss any questions you have with your health care provider. Document Released: 04/26/2016 Document Revised: 05/29/2016 Document Reviewed: 04/26/2016 Elsevier Interactive Patient Education  2017 [place].

## 2021-04-03 NOTE — Progress Notes (Addendum)
Assessment/Plan:   Gabriella Norman is a 65 y.o. year old female with risk factors including  hypertension, hyperlipidemia, asthma, seen today for evaluation of memory loss. CT head in 02/2021 was normal.  MoCA today is 16/ 30 with deficiencies in visuospatial/executive, language, abstraction, delayed recall  0/5, orientation 5/6.  A component of situational depression due to recent loss of her job due to shoulder trauma affecting her duties,  Covid in 01/2021, as well as language barrier noted.    Recommendations:   Memory Loss  Neurocognitive testing to further evaluate cognitive concerns, including potential contribution of sleep, stress, anxiety depression Check B12, TSH, CBC and CMP.  Discussed safety both in and out of the home.  Discussed the importance of regular daily schedule with inclusion of crossword puzzles to maintain brain function.  Monitor mood with PCP.  Stay active at least 30 minutes at least 3 times a week.  Naps should be scheduled and should be no longer than 60 minutes and should not occur after 2 PM.  Mediterranean diet is recommended  Folllow up once results above are available   Subjective:    The patient is seen in neurologic consultation at the request of Hyacinth Meeker, [place], [place] for the evaluation of memory.  The patient is accompanied by daughter who supplements the history. The patient is a 65 y.o. year old female who was in her usual state of health until April of this year, when she suffered a mechanical fall without fracture, currently on physical therapy.  Her head CT at that time was completely normal.  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  As the daughter reports "these because the drastic change in her life ".  Since April, the patient does not remember recent conversations, phone numbers, or who she was talking to.  She denies leaving objects in unusual places.  She does not carry a history of depression.   She denies irritability, vivid dreams, sleepwalking, hallucinations or paranoia.  She is independent of bathing and dressing.  Throughout her life, the patient has been forgetting to take her medications so "this is not new ".  However, she has been forgetting to pay some bills since April.  She drives and denies getting lost.  She ambulates without a cane or a walker.  She tries to remain active.  Her appetite is good, and denies trouble swallowing.  The patient cooks, and denies leaving the stove or the faucet on.  However, she forgets at times what she came to the to the kitchen.  She denies any dizziness, vertigo or syncope.  She has a prior  history of migraines resolved about 20 years ago.  She denies any double vision, focal numbness or tingling, unilateral weakness or tremors.  She does have soreness in the left shoulder due to trauma.  She denies urine incontinence or retention, constipation or diarrhea, or anosmia.  Denies a history of OSA, alcohol.  She did have COVID in May 2022.  Denies tobacco history.  Family history remarkable for mother with dementia of unknown type.  CT head 615/22: Normal  No recent labs  No Known Allergies  Current Outpatient Medications  Medication Instructions   benzonatate (TESSALON) 200 MG capsule Take 1 capsule by mouth three times daily as needed for cough   famotidine (PEPCID) 20 MG tablet One at bedtime<BR>   losartan-hydrochlorothiazide (HYZAAR) 50-12.5 MG tablet 1 (one) time each day at the  same time.   metFORMIN (GLUCOPHAGE) 500 MG tablet TAKE 1 TABLET BY MOUTH TWICE DAILY FOR 90 DAYS   pantoprazole (PROTONIX) 40 MG tablet TAKE 1 TABLET BY MOUTH ONCE DAILY(TAKE 30-60 MINUTES BEFORE FIRST MEAL OF THE DAY)   rosuvastatin (CRESTOR) 20 mg, Oral, Daily     VITALS:   Vitals:   04/03/21 0925  BP: (!) 146/79  Pulse: 87  SpO2: 97%  Weight: 187 lb 3.2 oz (84.9 kg)  Height: 5\' 6"  (1.676 m)   No flowsheet data found.  HEENT:  Normocephalic, atraumatic.  The mucous membranes are moist. The superficial temporal arteries are without ropiness or tenderness. Cardiovascular: Regular rate and rhythm. Lungs: Clear to auscultation bilaterally. Neck: There are no carotid bruits noted bilaterally.  NEUROLOGICAL:  Orientation:   Montreal Cognitive Assessment  04/03/2021  Visuospatial/ Executive (0/5) 2  Naming (0/3) 3  Attention: Read list of digits (0/2) 2  Attention: Read list of letters (0/1) 1  Attention: Serial 7 subtraction starting at 100 (0/3) 3  Language: Repeat phrase (0/2) 0  Language : Fluency (0/1) 0  Abstraction (0/2) 0  Delayed Recall (0/5) 0  Orientation (0/6) 5  Total 16  Adjusted Score (based on education) 16   Alert and oriented to person, place and time. No aphasia or dysarthria. Fund of knowledge is appropriate. Recent memory impaired and remote memory intact.  Attention and concentration are normal.  Able to name objects and repeat phrases. Delayed recall 0 /5 Cranial nerves: There is good facial symmetry. Extraocular muscles are intact and visual fields are full to confrontational testing. Speech is fluent and clear. Soft palate rises symmetrically and there is no tongue deviation. Hearing is intact to conversational tone. Tone: Tone is good throughout. Sensation: Sensation is intact to light touch and pinprick throughout. Vibration is intact at the bilateral big toe.There is no extinction with double simultaneous stimulation. There is no sensory dermatomal level identified. Coordination: The patient has no difficulty with RAM's or FNF bilaterally. Normal finger to nose  Motor: Strength is 5/5 in the Rl upper and lower extremities.LUE 4/5 due to R shoulder pain as well as some reduction of ROM in that arm.  There is no pronator drift. There are no fasciculations noted. DTR's: Deep tendon reflexes are 2/4 at the bilateral biceps, triceps, brachioradialis, patella and achilles.  Plantar responses are downgoing bilaterally. Gait  and Station: The patient is able to ambulate without difficulty.The patient is able to heel toe walk without any difficulty.The patient is able to ambulate in a tandem fashion. The patient is able to stand in the Romberg position.   CBC Latest Ref Rng & Units 05/06/2011 04/06/2008 08/05/2007  WBC 4.0 - 10.5 K/uL 6.1 6.9 5.8  Hemoglobin 12.0 - 15.0 g/dL 11.5(L) 12.1 13.2  Hematocrit 36.0 - 46.0 % 34.8(L) 36.6 40.7  Platelets 150 - 400 K/uL 276 238 SPECIMEN CHECKED FOR CLOTS 305     CMP Latest Ref Rng & Units 05/06/2011 04/06/2008 08/05/2007  Glucose 70 - 99 mg/dL 08/07/2007) 960(A) 92  BUN 6 - 23 mg/dL 10 12 14   Creatinine 0.50 - 1.10 mg/dL 540(J 8.11  Sodium 135 - 145 mEq/L 140 140 141  Potassium 3.5 - 5.1 mEq/L 3.3(L) 3.8 3.5  Chloride 96 - 112 mEq/L 105 105 102  CO2 19 - 32 mEq/L 27 28 26   Calcium 8.4 - 10.5 mg/dL 9.9 9.7 9.6  Total Protein - - 7.3 7.7  Total Bilirubin - - 0.6 0.4  Alkaline Phos - -  101 126(H)  AST - - 19 18  ALT - - 13 11      Thank you for allowing [place] the opportunity to participate in the care of this nice patient. Please do not hesitate to contact us for any questions or concerns.   Total time spent on today's visit was 60 minutes, including both face-to-face time and nonface-to-face time.  Time included that spent on review of records (prior notes available to me/labs/imaging if pertinent), discussing treatment and goals, answering patient's questions and coordinating care.  Cc:  Jackie Plum, MD  Marlowe Kays 04/03/2021 11:04 AM

## 2021-04-10 ENCOUNTER — Other Ambulatory Visit: Payer: Self-pay

## 2021-04-10 ENCOUNTER — Other Ambulatory Visit: Payer: 59

## 2021-04-10 DIAGNOSIS — R413 Other amnesia: Secondary | ICD-10-CM

## 2021-04-10 LAB — CBC
HCT: 38.8 % (ref 36.0–46.0)
Hemoglobin: 12.6 g/dL (ref 12.0–15.0)
MCHC: 32.4 g/dL (ref 30.0–36.0)
MCV: 78.2 fl (ref 78.0–100.0)
Platelets: 283 10*3/uL (ref 150.0–400.0)
RBC: 4.97 Mil/uL (ref 3.87–5.11)
RDW: 15.3 % (ref 11.5–15.5)
WBC: 6 10*3/uL (ref 4.0–10.5)

## 2021-04-10 LAB — VITAMIN B12: Vitamin B-12: 700 pg/mL (ref 211–911)

## 2021-04-10 LAB — TSH
TSH: 1.38 u[IU]/mL (ref 0.35–5.50)
TSH: 1.38 u[IU]/mL (ref 0.35–5.50)

## 2021-04-12 ENCOUNTER — Telehealth: Payer: Self-pay

## 2021-04-12 NOTE — Telephone Encounter (Signed)
Pt called an informed that labs are normal

## 2021-04-12 NOTE — Telephone Encounter (Signed)
-----   Message from Marcos Eke, PA-C sent at 04/10/2021  4:03 PM EDT ----- Please inform patient all the blood tests were normal. Thanks

## 2021-06-21 ENCOUNTER — Encounter: Payer: Self-pay | Admitting: Counselor

## 2021-06-22 ENCOUNTER — Other Ambulatory Visit: Payer: Self-pay

## 2021-06-22 ENCOUNTER — Ambulatory Visit: Payer: 59

## 2021-06-22 ENCOUNTER — Encounter: Payer: Self-pay | Admitting: Counselor

## 2021-06-22 ENCOUNTER — Ambulatory Visit (INDEPENDENT_AMBULATORY_CARE_PROVIDER_SITE_OTHER): Payer: 59 | Admitting: Counselor

## 2021-06-22 DIAGNOSIS — G3184 Mild cognitive impairment, so stated: Secondary | ICD-10-CM | POA: Diagnosis not present

## 2021-06-22 DIAGNOSIS — F09 Unspecified mental disorder due to known physiological condition: Secondary | ICD-10-CM

## 2021-06-22 NOTE — Progress Notes (Signed)
NEUROPSYCHOLOGICAL TEST SCORES [facility]  Patient Name: Gabriella Norman MRN: [id number] Date of Birth: 01/22/1956 Age: 65 y.o. Education: 14 years  Measurement properties of test scores: IQ, Index, and Standard Scores (SS): Mean = 100; Standard Deviation = 15 Scaled Scores (Ss): Mean = 10; Standard Deviation = 3 Z scores (Z): Mean = 0; Standard Deviation = 1 T scores (T); Mean = 50; Standard Deviation = 10  TEST SCORES:    Note: This summary of test scores accompanies the interpretive report and should not be interpreted by unqualified individuals or in isolation without reference to the report. Test scores are relative to age, gender, and educational history as available and appropriate.   Performance Validity        Rey 15 and Recognition: Raw Descriptor      Free Recall 9 Below Expectation      Recognition 6 ---      False Positives 0 ---      Combined Score 15 Below Expectation      The Dot Counting Test: Raw Descriptor      E-Score 9 Within Expectation      Embedded Measures: Raw Descriptor      RBANS Effort Index: 0 Within Expectation      WAIS-IV Reliable Digit Span 7 Within Expectation      WAIS-IV Reliable Digit Span Revised 12 Within Expectation      Mental Status Screening     Total Score Descriptor  MMSE 24 MCI      Expected Functioning        Wide Range Achievement Test (Word Reading): Standard/Scaled Score Percentile       Word Reading 73 4      Cognitive Testing        RBANS, Form : Standard/Scaled Score Percentile  Total Score 54 <1  Immediate Memory 53 <1      List Learning 3 1      Story Memory 2 <1  Visuospatial/Constructional 89 23      Figure Copy   (19) 11 63      Judgment of Line Orientation   (12) --- 3-9  Language 44 <1      Picture Naming --- <2      Semantic Fluency 2 <1  Attention 68 2      Digit Span 7 16      Coding 3 1  Delayed Memory 64 1      List Recall   (0) --- <2      List Recognition   (18) --- 10-16       Story Recall   (0) 1 <1      Figure Recall   (8) 6 9      Wechsler Adult Intelligence Scale - IV: Standard/Scaled Score Percentile  Working Memory Index 89 23      Digit Span 8 25          Digit Span Forward 10 50          Digit Span Backward 6 9          Digit Span Sequencing 10 50      Arithmetic 8 25      Neuropsychological Assessment Battery (Language Module): T-score Percentile      Naming   (10) 19 <1      Verbal Fluency: T-score Percentile      Semantic Fluency (Animals) 6 <1      Trail Making Test: T-Score Percentile  Part A 45 31      Part B 29 2      Modified [place] Card Sorting Test (MWCST): Standard/T-Score Percentile      Number of Categories Correct 19 <1      Number of Perseverative Errors 19 <1      Number of Total Errors 20 <1      Percent Perseverative Errors 28 2  Executive Function Composite 54 <1      Clock Drawing Raw Score Descriptor      Command 6 Mild Impairment      Rating Scales        Quick Dementia Rating System Raw Score Descriptor      Sum of Boxes 4 Very Mild Dementia      Total Score 6 Mild Dementia  Geriatric Depression Scale - Short Form 1 Negative   Arieal Cuoco V. Roseanne Reno PsyD, ABN Clinical Neuropsychologist

## 2021-06-22 NOTE — Progress Notes (Signed)
NEUROPSYCHOLOGICAL EVALUATION [facility]  Patient Name: Gabriella Norman MRN: [id number] Date of Birth: [birth year] Age: 65 y.o. Education: 14 years  Referral Circumstances and Background Information  Ms. Lecia Esperanza is a 65 y.o., right-hand dominant, married woman with a history of HTN, HLD, and mechanical fall in April, 2022 and memory problems over the past year. She also had a mechanical fall in April, 2022 although my sense after speaking with the patient and her son is that she did not sustain a head injury and her cognitive difficulties started well before this fall. She was seen recently by Durenda Age, PA-C who noted a MoCA of 16/30 but was also concerned about language barrier (patient is ESL).   On interview, the patient reported that she fell from a standing position while praying after falling asleep because she was tired. She believes she hit her head on the ground but she denied losing consciousness. She recalls becoming aware of her surroundings, people were standing around her, and they told her what happened. She did not go to the hospital or call the doctor and denied that she was in any pain, although she went to [place], PA-C the following day because she had a routine appointment. Her daughter told her that she was having shoulder pain and memory loss and evaluation was ordered at that time. The patient is here with her son, Gertha Calkin, who reported that her daughter was the first to notice her memory problems but subsequently he has noticed and other siblings have noticed. The main symptom they notice is repeating herself, asking the same questions, and forgetfulness. Asad will talk with her on the phone and she will say that she hasn't talked with him in a week, when they just spoke the other day. They feel that she does have some difficulties with orientation to time, for instance she fasts Mondays and Thursdays and is not fasting today because she thought it was  Wednesday. Her son reported that the patient's mother had similar issues in her late 51s, although they aren't sure if she had dementia because she was not diagnosed. She has no language problems. She has no hallucinations or delusions. The patient denied depressed mood. I see that she previously stated she was having a hard time since not working, she did admit that she prefers to work. She reported that her energy is good and she isn't having any problems sleeping. She usually gets about 7.5 hours of sleep per night. There is no dream enactment behavior. Her appetite is normal for her.   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 She denied that it is because of her shoulder or because of her accident and stated that she has been out of work for nearly a year. The history is a bit different today than it was with Ms. Wertman, because the family are denying that there are things that she cannot do that she used to do. Her daughter had previously said that she has been forgetting to take medications for some time but that she recently started forgetting to pay some of the bills. Today, the patient reported that she does manage money for her household and her son said that is true to the best of his knowledge She pays the bills using the computer. She drives and does not have a problem with driving. She does feel like  she is not as good with knowing where to go as she was in the past. She reported that she can use GPS if she wishes, and she does that more than in the past, but she still typically doesn't need it. She prepares food for her family, she doesn't forget ingredients in the recipes. She is able to use the community as needed, for instance to shop and does not have any problems.   Past Medical History and Review of Relevant Studies   Patient Active Problem List   Diagnosis Date Noted   Memory loss  04/03/2021   Pure hypercholesterolemia 04/23/2019   Type 2 diabetes mellitus without complication, without long-term current use of insulin (HCC) 04/23/2019   Benign essential HTN 04/17/2019   Cough variant asthma vs UACS 09/23/2018    Review of Neuroimaging and Relevant Medical History: CT head (03/01/2021) reviewed, showing normal brain volume and morphology. There do not appear to be any areas of concerning volume loss. The study is difficult to grade for leukoaraiosis but there is are no conspicuous areas of hypodensity that I can see.   Current Outpatient Medications  Medication Sig Dispense Refill   benzonatate (TESSALON) 200 MG capsule Take 1 capsule by mouth three times daily as needed for cough 30 capsule 0   famotidine (PEPCID) 20 MG tablet One at bedtime 30 tablet 11   losartan-hydrochlorothiazide (HYZAAR) 50-12.5 MG tablet 1 (one) time each day at the same time.     metFORMIN (GLUCOPHAGE) 500 MG tablet TAKE 1 TABLET BY MOUTH TWICE DAILY FOR 90 DAYS     pantoprazole (PROTONIX) 40 MG tablet TAKE 1 TABLET BY MOUTH ONCE DAILY(TAKE 30-60 MINUTES BEFORE FIRST MEAL OF THE DAY) 30 tablet 1   rosuvastatin (CRESTOR) 20 MG tablet Take 20 mg by mouth daily.     No current facility-administered medications for this visit.    No family history on file.  There is a family history of dementia, it sounds like the patient's mother had Alzheimer's from their description but it was never diagnosed. She forgot things, "Even her children," as per the patient's son. The patient also has an older sister with memory problems, she is 74. The patient is one of five children. There is no  family history of psychiatric illness.  Psychosocial History  Developmental, Educational and Employment History: The patient is a native of [place] and lived there until she was 19. She married her husband at that time and then moved to [place], they emigrated to the [place] in 2001. She did not speak English  until coming to the [place], she took ESL classes at [employer]. She reported she did them for "years and years" because she was working much of the time and had a hard time learning English sufficiently. She reported that she did fine in school and that the school system there is 12 years. She also went to [employer] and got an associates degree, she was not sure what she studied. She reported that she did adequately but it was very difficult studying in [place]. 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 She stopped working about a year ago. 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   Psychiatric History: The  patient denied any history of depression or psychiatric treatment.   Substance Use History: The patient does not drink alcohol or use any substances.   Relationship History and Living Cimcumstances: The patient has been married to her husband for 42 years. They have 8 children. The patient has two adult children living at home, she has a son in [place], one in [place], and several other children.   Mental Status and Behavioral Observations  Sensorium/Arousal: The patient's level of arousal was awake and alert. Hearing and vision were adequate for testing purposes. Orientation: The patient was oriented to person, place, and situation but she reported that it was Monday in July and was unsure of the date. She was correct as per the year.  Appearance: Dressed in appropriate, casual clothing Behavior: Pleasant and appropriate. As per technician, she did have problems understanding directions to some of the more complex tasks (e.g., modified wisconsin card sorting). Also appeared to have difficulties with alphabet on trails B and would lose her place. Accordingly  these measures may be unreliable.  Speech/language: The patient spoke with a thick accent. She did have some difficulties comprehending things at times during the interview, so her son provided aid. She did not need a translator and needed assistance only infrequently. No clear word finding problems noted.  Gait/Posture: Normal on exam with Ms. Wertman Movement: No rest tremor, obvious extrapyramidal symptoms etc.  Social Comportment: Appropriate Mood: "Fine" Affect: The patient's affect was mainly euthymic Thought process/content: Logical and goal oriented, seemed to have reasonable appreciation of memory problems as reported by son. No thought disorder. Thought content was appropriate to the topics discussed Safety: No concerns identified in this euthymic patient Insight: Fair  MMSE - Mini Mental State Exam 06/22/2021  Orientation to time 2  Orientation to Place 5  Registration 3  Attention/ Calculation 5  Recall 2  Language- name 2 objects 2  Language- repeat 0  Language- follow 3 step command 3  Language- read & follow direction 1  Write a sentence 1  Copy design 0  Total score 24   Test Procedures  Wide Range Achievement Test - 4   Word Reading Wechsler Adult Intelligence Scale - IV  Digit Span  Arithmetic NAB Naming (Form 1) Repeatable Battery for the Assessment of Neuropsychological Status (Form A) Rey 15-item Test and Recognition The Dot Counting Test Semantic Fluency (Animals) Trail Making Test A & B Modified [employer] Test GDS-SF Clock Drawing [place] Drawing  Plan  Rosezella Ghuman was seen for a psychiatric diagnostic evaluation and neuropsychological testing. She is a 65 year old, right-hand dominant woman with a history of primarily memory changes over the past year. She is here with her son who noticed the changes after they were pointed out by the patient's daughter, she repeats herself, and they had also previously reported that she was  missing bills. She has some family history of dementia. Assessment was confounded to some extent by cultural/language differences, as the patient is a native [place] speaker and has never been particularly facile with [place]. I discussed with her and her son the risks and benefits of evaluation and that the findings may be inconclusive but they wished to proceed. The battery was modified as appropriate. Full and complete note with impressions, recommendations, and interpretation of test data to follow.   Bettye Boeck Roseanne Reno, PsyD, ABN Clinical Neuropsychologist  Informed Consent  Risks and benefits of the evaluation were discussed with the patient prior to all testing procedures. I  conducted a clinical interview and neuropsychological testing (at least two tests) with Shevette Lansberry and Clare Charon, B.S. (Technician) administered additional test procedures. The patient was able to tolerate the testing procedures and the patient (and/or family if applicable) is likely to benefit from further follow up to receive the diagnosis and treatment recommendations, which will be rendered at the next encounter.

## 2021-06-22 NOTE — Progress Notes (Signed)
This encounter was created in error - please disregard.

## 2021-06-22 NOTE — Progress Notes (Signed)
   Psychometrist Note   Cognitive testing was administered to [name] by Lamar Benes, B.S. (Technician) under the supervision of Alphonzo Severance, Psy.D., ABN. Gabriella Norman was able to tolerate all test procedures. Dr. Nicole Kindred met with the patient as needed to manage any emotional reactions to the testing procedures. Rest breaks were offered.    The battery of tests administered was selected by Dr. Nicole Kindred with consideration to the patient's current level of functioning, the nature of her symptoms, emotional and behavioral responses during the interview, level of literacy, observed level of motivation/effort, and the nature of the referral question. This battery was communicated to the psychometrist. Communication between Dr. Nicole Kindred and the psychometrist was ongoing throughout the evaluation and Dr. Nicole Kindred was immediately accessible at all times. Dr. Nicole Kindred provided supervision to the technician on the date of this service, to the extent necessary to assure the quality of all services provided.    Gabriella Norman will return in approximately one week for an interactive feedback session with Dr. Nicole Kindred, at which time test performance, clinical impressions, and treatment recommendations will be reviewed in detail. The patient understands she can contact our office should she require our assistance before this time.   A total of 90 minutes of billable time were spent with Gabriella Norman by the technician, including test administration and scoring time. Billing for these services is reflected in Dr. Les Pou note.   This note reflects time spent with the psychometrician and does not include test scores, clinical history, or any interpretations made by Dr. Nicole Kindred. The full report will follow in a separate note.

## 2021-06-27 NOTE — Progress Notes (Signed)
NEUROPSYCHOLOGICAL EVALUATION [facility]  Patient Name: Gabriella Norman MRN: [id number] Date of Birth: 1955-12-31 Age: 65 y.o. Education: 14 years  Clinical Impressions  Gabriella Norman is a 65 y.o., right-hand dominant, married woman with a history of HTN, HLD, DM2, and memory and thinking problems since April, 2022. Her family have noticed her memory and thinking problems since she fell, although she denies hitting her head during the fall and they perceive her problems to be gradually progressive as opposed to acute in onset. The patient's main symptoms are forgetting things that her family tell her, repeating herself, and the like. There is some inconsistency about the level of difficulty this causes, because her daughter reported she was missing bills but the patient's son who presented as well informed denied that she is having difficulties functioning in any area. The patient herself does not feel as good with directions. Complicating assessment, she is from [place] and has limited faculty with the [place] language.   Neuropsychological test findings must be interpreted with significant caution in this individual who speaks [place] as a second language. She also scored below the level of expectations on one standalone validity indicator with minimal language task demands. While it is unclear if this is related to cultural or other factors, it does further temper expectations for test performance. In the context of these findings, her profile is difficult to interpret. She demonstrated extremely low overall cognitive functioning with relatively preserved performance on measures of visuospatial/constructional functioning and low scores in other domains. With respect to memory, her immediate recall and delayed recall for verbal information was extremely low but she did demonstrate some benefit to cuing. Her delayed recall for visual information in the form of a modestly complex figure  stimulus was significantly better although her performance was still unusually low. She has naming and semantic fluency problems but I am not sure these scores are meaningful given her ESL status and suspect they are not. I do think she has some level of executive difficulty but that her performance on executive measures was also undermined by difficulties with test task instructions and faculty with the alphabet. Performance was reasonable on measures of working memory and low on timed number-symbol coding, a graphomotor measure of processing speed that is thought to have minimal language task demands. Her son characterized her as functioning at a very mild to mild dementia level, with the most difficulties in memory. She screened negative for the presence of depression.   [name] is thus demonstrating cognitive test data that suggests possible memory problems and some executive dysfunction when interpreted cautiously in lieu of her ESL status. Her family has noticed and is concerned about the changes, and her son rates her as having a decline relative to her own personal (culturally appropriate) baseline; therefore, I do think these data suggest some level of cognitive decline. Mild cognitive impairment seems more prudent than dementia, given multiple confounds and preserved functional status. Her decline may certainly be due to a progressive condition given clinical features and strong family history and I would recommend that she engage in primary prevention at this time.    Diagnostic Impressions: Amnestic mild cognitive impairment   Recommendations to be discussed with patient  Your performance and presentation on neuropsychological assessment must be interpreted cautiously given cultural/language confounds. As we discussed, these tests are designed for native [place] English-speaking samples. That means that when these tests are applied in other circumstances, the resulting scores may not be  as  appropriate. This makes interpreting your test data very challenging. Nevertheless, considering all information, I do think that there has been some cognitive decline. I do not think that you have a dementia at the present time, but rather that you have mild cognitive impairment.  The major difference between mild cognitive impairment (MCI) and dementia is in severity and potential prognosis. Once someone reaches a level of severity adequate to be diagnosed with a dementia, there is usually progression over time, though this may be years. On the other hand, mild cognitive impairment, while a significant risk for dementia in future, does not always progress to dementia, and in some instances stays the same or can even revert to normal. It is important to realize that if MCI is due to underlying Alzheimer's disease, it will most likely progress to dementia eventually. The rate of conversion to Alzheimer's dementia from amnestic MCI is about 15% per year versus the general population risk of conversion of 2% per year.   In your case, it is possible that your mild cognitive impairment is due to an underlying condition although that is not entirely clear. This is why you should be followed over time.  I can also give you some suggestions to minimize the chances of any cognitive impairment if this is something at risk of getting worse.  There is now good quality evidence from at least one large scale study that a modified mediterranean diet may help slow cognitive decline. This is known as the "MIND" diet. The Mind diet is not so much a specific diet as it is a set of recommendations for things that you should and should not eat.   Foods that are ENCOURAGED on the MIND Diet:  Green, leafy vegetables: Aim for six or more servings per week. This includes kale, spinach, cooked greens and salads.  All other vegetables: Try to eat another vegetable in addition to the green leafy vegetables at least once a day. It  is best to choose non-starchy vegetables because they have a lot of nutrients with a low number of calories.  Berries: Eat berries at least twice a week. There is a plethora of research on strawberries, and other berries such as blueberries, raspberries and blackberries have also been found to have antioxidant and brain health benefits.  Nuts: Try to get five servings of nuts or more each week. The creators of the MIND diet don't specify what kind of nuts to consume, but it is probably best to vary the type of nuts you eat to obtain a variety of nutrients. Peanuts are a legume and do not fall into this category.  Olive oil: Use olive oil as your main cooking oil. There may be other heart-healthy alternatives such as algae oil, though there is not yet sufficient research upon which to base a formal recommendation.  Whole grains: Aim for at least three servings daily. Choose minimally processed grains like oatmeal, quinoa, brown rice, whole-wheat pasta and 100% whole-wheat bread.  Fish: Eat fish at least once a week. It is best to choose fatty fish like salmon, sardines, trout, tuna and mackerel for their high amounts of omega-3 fatty acids.  Beans: Include beans in at least four meals every week. This includes all beans, lentils and soybeans.  Poultry: Try to eat chicken or [place] at least twice a week. Note that fried chicken is not encouraged on the MIND diet.  Wine: Aim for no more than one glass of alcohol daily. Both red and white wine  may benefit the brain. However, much research has focused on the red wine compound resveratrol, which may help protect against Alzheimer's disease.  Foods that are DISCOURAGED on the MIND Diet: Butter and margarine: Try to eat less than 1 tablespoon (about 14 grams) daily. Instead, try using olive oil as your primary cooking fat, and dipping your bread in olive oil with herbs.  Cheese: The MIND diet recommends limiting your cheese consumption to less than once per  week.  Red meat: Aim for no more than three servings each week. This includes all beef, pork, lamb and products made from these meats.  [place] food: The MIND diet highly discourages fried food, especially the kind from fast-food restaurants. Limit your consumption to less than once per week.  Pastries and sweets: This includes most of the processed junk food and desserts you can think of. Ice cream, cookies, brownies, snack cakes, donuts, candy and more. Try to limit these to no more than four times a week.  Exercise is one of the best medicines for promoting health and maintaining cognitive fitness at all stages in life. Exercise probably has the largest documented effect on brain health and performance of any lifestyle intervention. Studies have shown that even previously sedentary individuals who start exercising as late as age 12 show a significant survival benefit as compared to their non-exercising peers. In the [place], the current guidelines are for 30 minutes of moderate exercise per day, but increasing your activity level less than that may also be helpful. You do not have to get your 30 minutes of exercise in one shot and exercising for short periods of time spread throughout the day can be helpful. Go for several walks, learn to dance, or do something else you enjoy that gets your body moving. Of course, if you have an underlying medical condition or there is any question about whether it is safe for you to exercise, you should consult a medical treatment provider prior to beginning exercise.   Countless studies have found a link between diabetes (particularly midlife diabetes) and dementia, with general estimates of about 1.5 - 2 times the general population risk of developing dementia for diabetic individuals. Individuals with poorly controlled diabetes in midlife are at particularly high risk with those having an HbA1c ? 6.5% at about a 3-fold risk and those with an HbA1c ? 7% at a 5-fold  risk of developing dementia as compared with the general population. Work diligently to control your diabetes. Many of the healthy lifestyle changes that can help manage blood sugar (things like exercise and diet) have also been shown to have beneficial effects on brain health.   You should return for follow-up evaluation in 18 to 24 months.  Test Findings  Test scores are summarized in additional documentation associated with this encounter. Test scores are relative to age, gender, and educational history as available and appropriate. As described in the impression section and below cognitive domain sections, interpretation of testing must proceed cautiously in this individual who is a nonnative [place] speaker. The patient also had performance below expectations on one visually mediated indicator of performance validity. In the context of these findings test data are difficult to interpret, although I do think that there is likely still some signal.  General Intellectual Functioning/Achievement:  Performance on single word reading fell at an unusually low level, equivalent to a 4.9th grade level. This further reflects limited faculty with the [place] language, thereby substantiating the need for interpretive caution.  Attention  and Processing Efficiency: Performance was low average on the working memory index from the WAIS-IV. Fairly reasonable scores were achieved on measures of digit repetition forward and digit reach sequencing in ascending order, which were average. Digit repetition backward was unusually low. Mental solving of arithmetical word problems was average.  Performance was average on simple numeric sequencing. By contrast, timed number-symbol coding was extremely low. This score has minimal language task demands, although it does involve symbolic processing similar to that involved in reading, and therefore could be affected in individuals with less experience reading due to limited  literacy.  Language: Performance on most measures within this domain was likely undermined by the patient's ESL status. She performed at an extremely low level on two measures of visual object confrontation naming, scores that are difficult to interpret due to the way this particular test functions in non-- native speakers. Generation of words by category was extremely low for both animals and fruits and vegetables.  Visuospatial Function: Performance on visuospatial/constructional measures fell at a low average level on the RBANS. Figure copy was average. Judgment of angular line orientations was unusually low. This is an area of strength for this patient, which may also of course reflect less in the way of language test demands.  Learning and Memory: Performance on measures of learning and memory showed very low levels of initial encoding and then difficulties with retention of information over time. She did retain visual much better than verbal information, suggesting that some of this is related to language confound. On the other hand, she did recognize many of the words from the list that she had been asked to remember, which might 1-lead to assume that she retained them yet could not retrieve them.  In the verbal realm, immediate recall for a 10-item word list and a brief short story was extremely low. She did not recall any of these words or any of the short story details on long delayed recall, although she did somewhat better when provided with recognition cues, with low average delayed recognition for the words from the list versus false choices.  In the visual realm, her performance is low but significantly better, with unusually low delayed recall of a modestly complex figure.  Executive Functions: Performance on executive indicators is difficult to interpret in the setting of this patient's ESL issues and somewhat more complex task instructions. Her Executive Function Composite score on  the [employer] was extremely low.  As per technician, the patient did not seem to understand this test, and additional explanation was provided to the extent possible, although this did not seem to help. She did have an extremely high number of perseverative errors, which is a bit difficult to interpret as due to a language confound, although it is not entirely clear. Alternating sequencing of numbers and letters of the alphabet was extremely low, patient appeared to have the most difficulties finding which letter was next as per technician, and therefore this may reflect cultural factors more than it does executive impairment. She did have a very hard time with clock drawing, despite appearing to understand the test instructions, and set the hands in a stimulus bound fashion (this seems more likely to be an actual finding).  Rating Scale(s): Most importantly, Ms. Diablo's son Asad rated her as functioning at a very mild to mild dementia level even when instructed to minimize any impact of noncognitive issues and rate her relative to her own previous performance. She screened negative  for the presence of depression.  Bettye Boeck Roseanne Reno, PsyD, ABN Clinical Neuropsychologist  Coding and Compliance  Billing below reflects technician time, my direct face-to-face time with the patient, time spent in test administration, and time spent in professional activities including but not limited to: neuropsychological test interpretation, integration of neuropsychological test data with clinical history, report preparation, treatment planning, care coordination, and review of diagnostically pertinent medical history or studies.   Services associated with this encounter: Clinical Interview [phone]) plus 175 minutes (96132/96133; Neuropsychological Evaluation by Professional)  20 minutes (96136/96137; Test Administration by Professional) 90 minutes ([id number]/[id number]; Neuropsychological Testing by  Technician)

## 2021-07-03 ENCOUNTER — Ambulatory Visit: Payer: 59 | Admitting: Physician Assistant

## 2021-07-06 ENCOUNTER — Ambulatory Visit (INDEPENDENT_AMBULATORY_CARE_PROVIDER_SITE_OTHER): Payer: 59 | Admitting: Counselor

## 2021-07-06 ENCOUNTER — Encounter: Payer: 59 | Admitting: Counselor

## 2021-07-06 ENCOUNTER — Other Ambulatory Visit: Payer: Self-pay

## 2021-07-06 ENCOUNTER — Encounter: Payer: Self-pay | Admitting: Counselor

## 2021-07-06 DIAGNOSIS — G3184 Mild cognitive impairment, so stated: Secondary | ICD-10-CM

## 2021-07-06 NOTE — Patient Instructions (Signed)
Your performance and presentation on neuropsychological assessment must be interpreted cautiously given cultural/language confounds. As we discussed, these tests are designed for native [place] English-speaking samples. That means that when these tests are applied in other circumstances, the resulting scores may not be as appropriate. This makes interpreting your test data very challenging. Nevertheless, considering all information, I do think that there has been some cognitive decline. I do not think that you have a dementia at the present time, but rather that you have mild cognitive impairment.  The major difference between mild cognitive impairment (MCI) and dementia is in severity and potential prognosis. Once someone reaches a level of severity adequate to be diagnosed with a dementia, there is usually progression over time, though this may be years. On the other hand, mild cognitive impairment, while a significant risk for dementia in future, does not always progress to dementia, and in some instances stays the same or can even revert to normal. It is important to realize that if MCI is due to underlying Alzheimer's disease, it will most likely progress to dementia eventually. The rate of conversion to Alzheimer's dementia from amnestic MCI is about 15% per year versus the general population risk of conversion of 2% per year.    In your case, it is possible that your mild cognitive impairment is due to an underlying condition although that is not entirely clear. This is why you should be followed over time.  I can also give you some suggestions to minimize the chances of any cognitive impairment if this is something at risk of getting worse.  There is now good quality evidence from at least one large scale study that a modified mediterranean diet may help slow cognitive decline. This is known as the "MIND" diet. The Mind diet is not so much a specific diet as it is a set of recommendations for  things that you should and should not eat.   Foods that are ENCOURAGED on the MIND Diet:  Green, leafy vegetables: Aim for six or more servings per week. This includes kale, spinach, cooked greens and salads.  All other vegetables: Try to eat another vegetable in addition to the green leafy vegetables at least once a day. It is best to choose non-starchy vegetables because they have a lot of nutrients with a low number of calories.  Berries: Eat berries at least twice a week. There is a plethora of research on strawberries, and other berries such as blueberries, raspberries and blackberries have also been found to have antioxidant and brain health benefits.  Nuts: Try to get five servings of nuts or more each week. The creators of the MIND diet don't specify what kind of nuts to consume, but it is probably best to vary the type of nuts you eat to obtain a variety of nutrients. Peanuts are a legume and do not fall into this category.  Olive oil: Use olive oil as your main cooking oil. There may be other heart-healthy alternatives such as algae oil, though there is not yet sufficient research upon which to base a formal recommendation.  Whole grains: Aim for at least three servings daily. Choose minimally processed grains like oatmeal, quinoa, brown rice, whole-wheat pasta and 100% whole-wheat bread.  Fish: Eat fish at least once a week. It is best to choose fatty fish like salmon, sardines, trout, tuna and mackerel for their high amounts of omega-3 fatty acids.  Beans: Include beans in at least four meals every week. This includes  all beans, lentils and soybeans.  Poultry: Try to eat chicken or [place] at least twice a week. Note that fried chicken is not encouraged on the MIND diet.  Wine: Aim for no more than one glass of alcohol daily. Both red and white wine may benefit the brain. However, much research has focused on the red wine compound resveratrol, which may help protect against Alzheimer's  disease.  Foods that are DISCOURAGED on the MIND Diet: Butter and margarine: Try to eat less than 1 tablespoon (about 14 grams) daily. Instead, try using olive oil as your primary cooking fat, and dipping your bread in olive oil with herbs.  Cheese: The MIND diet recommends limiting your cheese consumption to less than once per week.  Red meat: Aim for no more than three servings each week. This includes all beef, pork, lamb and products made from these meats.  [place] food: The MIND diet highly discourages fried food, especially the kind from fast-food restaurants. Limit your consumption to less than once per week.  Pastries and sweets: This includes most of the processed junk food and desserts you can think of. Ice cream, cookies, brownies, snack cakes, donuts, candy and more. Try to limit these to no more than four times a week.   Exercise is one of the best medicines for promoting health and maintaining cognitive fitness at all stages in life. Exercise probably has the largest documented effect on brain health and performance of any lifestyle intervention. Studies have shown that even previously sedentary individuals who start exercising as late as age 32 show a significant survival benefit as compared to their non-exercising peers. In the [place], the current guidelines are for 30 minutes of moderate exercise per day, but increasing your activity level less than that may also be helpful. You do not have to get your 30 minutes of exercise in one shot and exercising for short periods of time spread throughout the day can be helpful. Go for several walks, learn to dance, or do something else you enjoy that gets your body moving. Of course, if you have an underlying medical condition or there is any question about whether it is safe for you to exercise, you should consult a medical treatment provider prior to beginning exercise.    Countless studies have found a link between diabetes (particularly  midlife diabetes) and dementia, with general estimates of about 1.5 - 2 times the general population risk of developing dementia for diabetic individuals. Individuals with poorly controlled diabetes in midlife are at particularly high risk with those having an HbA1c ? 6.5% at about a 3-fold risk and those with an HbA1c ? 7% at a 5-fold risk of developing dementia as compared with the general population. Work diligently to control your diabetes. Many of the healthy lifestyle changes that can help manage blood sugar (things like exercise and diet) have also been shown to have beneficial effects on brain health.    You should return for follow-up evaluation in 18 to 24 months.

## 2021-07-06 NOTE — Progress Notes (Signed)
   NEUROPSYCHOLOGY FEEDBACK NOTE  Neurology  Feedback Note: I met with Gabriella Norman to review the findings resulting from her neuropsychological evaluation. Since the last appointment, she has been about the same.Time was spent reviewing the impressions and recommendations that are detailed in the evaluation report. We discussed cultural confounds in the present case, test performance, and impression of likely mild cognitive impairment level problem. I was candid with the patient and her son that this could be the start of something progressive but given the cultural confounds and equivocal test findings, I do not feel that I can say that is necessarily the case. Encouraged them to come back as needed and prioritize primary prevention. Other topics of discussion as reflected in the patient instructions. I took time to explain the findings and answer all the patient's questions. I encouraged Gabriella Norman to contact me should she have any further questions or if further follow up is desired.   Current Medications and Medical History   Current Outpatient Medications  Medication Sig Dispense Refill   benzonatate (TESSALON) 200 MG capsule Take 1 capsule by mouth three times daily as needed for cough 30 capsule 0   famotidine (PEPCID) 20 MG tablet One at bedtime 30 tablet 11   losartan-hydrochlorothiazide (HYZAAR) 50-12.5 MG tablet 1 (one) time each day at the same time.     metFORMIN (GLUCOPHAGE) 500 MG tablet TAKE 1 TABLET BY MOUTH TWICE DAILY FOR 90 DAYS     pantoprazole (PROTONIX) 40 MG tablet TAKE 1 TABLET BY MOUTH ONCE DAILY(TAKE 30-60 MINUTES BEFORE FIRST MEAL OF THE DAY) 30 tablet 1   rosuvastatin (CRESTOR) 20 MG tablet Take 20 mg by mouth daily.     No current facility-administered medications for this visit.    Patient Active Problem List   Diagnosis Date Noted   Memory loss 04/03/2021   Pure hypercholesterolemia 04/23/2019   Type 2 diabetes mellitus without complication, without  long-term current use of insulin (HCC) 04/23/2019   Benign essential HTN 04/17/2019   Cough variant asthma vs UACS 09/23/2018    Mental Status and Behavioral Observations  Gabriella Norman presented on time to the present encounter and was alert and generally oriented. Speech was normal in rate, rhythm, volume, and prosody. Self-reported mood was "Good" and affect was euthymic. Thought process was logical and goal oriented and thought content was appropriate to the topics discussed. There were no safety concerns identified at today's encounter, such as thoughts of harming self or others.   Plan  Feedback provided regarding the patient's neuropsychological evaluation. She likely has MCI, which could be due to an underlying condition but it is not entirely clear and she is still fairly young. I think watchful waiting makes the most sense at this time. Gabriella Norman was encouraged to contact me if any questions arise or if further follow up is desired.    V. , PsyD, ABN Clinical Neuropsychologist  Service(s) Provided at This Encounter: 31 minutes ([id number]; Conjoint therapy with patient present)    

## 2021-07-10 ENCOUNTER — Ambulatory Visit: Payer: 59 | Admitting: Physician Assistant

## 2021-09-29 ENCOUNTER — Encounter: Payer: Self-pay | Admitting: Orthopaedic Surgery

## 2021-09-29 ENCOUNTER — Ambulatory Visit (INDEPENDENT_AMBULATORY_CARE_PROVIDER_SITE_OTHER): Payer: 59 | Admitting: Orthopaedic Surgery

## 2021-09-29 ENCOUNTER — Other Ambulatory Visit: Payer: Self-pay

## 2021-09-29 DIAGNOSIS — M75121 Complete rotator cuff tear or rupture of right shoulder, not specified as traumatic: Secondary | ICD-10-CM

## 2021-09-29 DIAGNOSIS — S43431A Superior glenoid labrum lesion of right shoulder, initial encounter: Secondary | ICD-10-CM

## 2021-09-29 NOTE — Progress Notes (Signed)
Office Visit Note   Patient: Gabriella Norman           Date of Birth: 08/02/1956           MRN: [id number] Visit Date: 09/29/2021              Requested by: Jackie Plum, MD [address] [place],  [place] [address] PCP: Jackie Plum, MD   Assessment & Plan: Visit Diagnoses:  1. Superior labrum anterior-to-posterior (SLAP) tear of right shoulder   2. Nontraumatic complete tear of right rotator cuff     Plan: Based on these findings and the lack of improvement from 10 months of conservative management have recommended arthroscopic treatment in the form of extensive debridement and biceps tenotomy and subacromial decompression with repair of the rotator cuff as indicated.  Risk benefits rehab recovery prognosis of the surgery reviewed with the patient detail.  Follow-Up Instructions: No follow-ups on file.   Orders:  Orders Placed This Encounter  Procedures   Hemoglobin A1C   No orders of the defined types were placed in this encounter.     Procedures: No procedures performed   Clinical Data: No additional findings.   Subjective: Chief Complaint  Patient presents with   Left Shoulder - Pain    HPI  Patient is a very pleasant 66 year old female who is here with her daughter.  She is here for evaluation of chronic right shoulder pain for approximately 10 months.  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  Unfortunately this claim was denied by [employer].  She is currently using private insurance.  She has been seeing Dr. Ave Filter at [facility] orthopedics since May 2022.  She has undergone several cortisone injections as well as a course of outpatient physical therapy without significant improvement.  She had an MRI done at [facility] on 03/04/2021 which demonstrated a SLAP tear, moderate biceps tendinopathy, severe supraspinatus tendinopathy with partial articular surface tears.    Review of  Systems  Constitutional: Negative.   HENT: Negative.    Eyes: Negative.   Respiratory: Negative.    Cardiovascular: Negative.   Endocrine: Negative.   Musculoskeletal: Negative.   Neurological: Negative.   Hematological: Negative.   Psychiatric/Behavioral: Negative.    All other systems reviewed and are negative.   Objective: Vital Signs: There were no vitals taken for this visit.  Physical Exam Vitals and nursing note reviewed.  Constitutional:      Appearance: She is well-developed.  Pulmonary:     Effort: Pulmonary effort is normal.  Skin:    General: Skin is warm.     Capillary Refill: Capillary refill takes less than 2 seconds.  Neurological:     Mental Status: She is alert and oriented to person, place, and time.  Psychiatric:        Behavior: Behavior normal.        Thought Content: Thought content normal.        Judgment: Judgment normal.    Ortho Exam  Examination of the left shoulder today shows pain with testing of the rotator cuff and impingement testing.  She has full range of motion but has moderate pain at the extremes.  She has pain with testing of the superior labrum.  Specialty Comments:  No specialty comments available.  Imaging: No results found.   PMFS History: Patient Active Problem List   Diagnosis Date Noted   Superior labrum anterior-to-posterior (SLAP)  tear of right shoulder 09/29/2021   Mild cognitive impairment 07/06/2021   Memory loss 04/03/2021   Pure hypercholesterolemia 04/23/2019   Type 2 diabetes mellitus without complication, without long-term current use of insulin (HCC) 04/23/2019   Benign essential HTN 04/17/2019   Cough variant asthma vs UACS 09/23/2018   Past Medical History:  Diagnosis Date   Asthma    DM (diabetes mellitus) (HCC)    Hyperlipidemia    Hypertension     No family history on file.  History reviewed. No pertinent surgical history. Social History   Occupational History   Not on file  Tobacco Use    Smoking status: Never   Smokeless tobacco: Never  Vaping Use   Vaping Use: Never used  Substance and Sexual Activity   Alcohol use: Never   Drug use: Never   Sexual activity: Not on file

## 2021-09-30 LAB — HEMOGLOBIN A1C
Hgb A1c MFr Bld: 9.2 % of total Hgb — ABNORMAL HIGH (ref <5.7)
Mean Plasma Glucose: 217 mg/dL
eAG (mmol/L): 12 mmol/L

## 2021-09-30 LAB — EXTRA SPECIMEN

## 2021-10-02 ENCOUNTER — Telehealth: Payer: Self-pay | Admitting: Orthopaedic Surgery

## 2021-10-02 NOTE — Telephone Encounter (Signed)
I think it should be fine since it's just scope and debridement.  Thanks.

## 2021-10-02 NOTE — Telephone Encounter (Signed)
FYI I spoke with patient's daughter and advised A1C is 9.2, however, Dr. Roda Shutters felt that would be ok since it is just scope and debridement. She is aware that you all will call to schedule. She asked if patient would need to see PCP prior to surgery due to A1C. I advised she should reach out to PCP irregardless as they may need to follow closely and adjust medications if they deem necessary. She expressed understanding.

## 2021-10-02 NOTE — Telephone Encounter (Signed)
A1C 9.2.  xu, you want him to be below 8.0 for surgery?

## 2021-10-02 NOTE — Telephone Encounter (Signed)
Pt's daughter calling on behalf of the pt to get results from blood test from appt on 09/29/21. The best call back number is [phone].

## 2021-10-02 NOTE — Telephone Encounter (Signed)
Please advise 

## 2021-10-03 NOTE — Telephone Encounter (Signed)
Ok, thanks.

## 2021-10-11 ENCOUNTER — Other Ambulatory Visit: Payer: Self-pay | Admitting: Internal Medicine

## 2021-10-11 DIAGNOSIS — Z1231 Encounter for screening mammogram for malignant neoplasm of breast: Secondary | ICD-10-CM

## 2021-10-27 ENCOUNTER — Ambulatory Visit: Payer: 59

## 2021-12-08 NOTE — Progress Notes (Shared)
?[facility] - Clinic Note ? ?12/11/2021 ? ?  ? ?CHIEF COMPLAINT ?Patient presents for No chief complaint on file. ? ? ?HISTORY OF PRESENT ILLNESS: ?Gabriella Norman is a 66 y.o. female who presents to the clinic today for:  ? ? ? ?Referring physician: ?No referring provider defined for this encounter. ? ?HISTORICAL INFORMATION:  ? ?Selected notes from the MEDICAL RECORD NUMBER ?Referred by Dr. Marland KitchenLEE:  ?Ocular Hx- ?PMH- ?  ? ?CURRENT MEDICATIONS: ?No current outpatient medications on file. (Ophthalmic Drugs)  ? ?No current facility-administered medications for this visit. (Ophthalmic Drugs)  ? ?Current Outpatient Medications (Other)  ?Medication Sig  ? benzonatate (TESSALON) 200 MG capsule Take 1 capsule by mouth three times daily as needed for cough  ? famotidine (PEPCID) 20 MG tablet One at bedtime  ? losartan-hydrochlorothiazide (HYZAAR) 50-12.5 MG tablet 1 (one) time each day at the same time.  ? metFORMIN (GLUCOPHAGE) 500 MG tablet TAKE 1 TABLET BY MOUTH TWICE DAILY FOR 90 DAYS  ? pantoprazole (PROTONIX) 40 MG tablet TAKE 1 TABLET BY MOUTH ONCE DAILY(TAKE 30-60 MINUTES BEFORE FIRST MEAL OF THE DAY)  ? rosuvastatin (CRESTOR) 20 MG tablet Take 20 mg by mouth daily.  ? ?No current facility-administered medications for this visit. (Other)  ? ? ? ? ?REVIEW OF SYSTEMS: ? ? ? ?ALLERGIES ?No Known Allergies ? ?PAST MEDICAL HISTORY ?Past Medical History:  ?Diagnosis Date  ? Asthma   ? DM (diabetes mellitus) (HCC)   ? Hyperlipidemia   ? Hypertension   ? ?No past surgical history on file. ? ?FAMILY HISTORY ?No family history on file. ? ?SOCIAL HISTORY ?Social History  ? ?Tobacco Use  ? Smoking status: Never  ? Smokeless tobacco: Never  ?Vaping Use  ? Vaping Use: Never used  ?Substance Use Topics  ? Alcohol use: Never  ? Drug use: Never  ? ?  ? ?  ? ?OPHTHALMIC EXAM: ? ?Not recorded ?  ? ? ?IMAGING AND PROCEDURES  ?Imaging and Procedures for 12/11/2021 ? ? ? ?  ?  ? ?  ?ASSESSMENT/PLAN: ? ?No diagnosis  found. ? ?1. ? ?2. ? ?3. ? ?Ophthalmic Meds Ordered this visit:  ?No orders of the defined types were placed in this encounter. ? ? ?  ? ?No follow-ups on file. ? ?There are no Patient Instructions on file for this visit. ? ? ?Explained the diagnoses, plan, and follow up with the patient and they expressed understanding.  Patient expressed understanding of the importance of proper follow up care.  ? ?This document serves as a record of services personally performed by Karie Chimera, MD, PhD. It was created on their behalf by Annalee Genta, COMT. The creation of this record is the provider's dictation and/or activities during the visit. ? ?Electronically signed by: Annalee Genta, COMT 12/08/21 10:59 AM ? ? ? ?Karie Chimera, M.D., Ph.D. ?Diseases & Surgery of the Retina and Vitreous ?[facility] ?@TODAY @ ? ? ? ? ?Abbreviations: ?M myopia (nearsighted); A astigmatism; H hyperopia (farsighted); P presbyopia; Mrx spectacle prescription;  CTL contact lenses; OD right eye; OS left eye; OU both eyes  XT exotropia; ET esotropia; PEK punctate epithelial keratitis; PEE punctate epithelial erosions; DES dry eye syndrome; MGD meibomian gland dysfunction; ATs artificial tears; PFAT's preservative free artificial tears; NSC nuclear sclerotic cataract; PSC posterior subcapsular cataract; ERM epi-retinal membrane; PVD posterior vitreous detachment; RD retinal detachment; DM diabetes mellitus; DR diabetic retinopathy; NPDR non-proliferative diabetic retinopathy; PDR proliferative diabetic  retinopathy; CSME clinically significant macular edema; DME diabetic macular edema; dbh dot blot hemorrhages; CWS cotton wool spot; POAG primary open angle glaucoma; C/D cup-to-disc ratio; HVF humphrey visual field; GVF goldmann visual field; OCT optical coherence tomography; IOP intraocular pressure; BRVO Branch retinal vein occlusion; CRVO central retinal vein occlusion; CRAO central retinal artery occlusion; BRAO branch  retinal artery occlusion; RT retinal tear; SB scleral buckle; PPV pars plana vitrectomy; VH Vitreous hemorrhage; PRP panretinal laser photocoagulation; IVK intravitreal kenalog; VMT vitreomacular traction; MH Macular hole;  NVD neovascularization of the disc; NVE neovascularization elsewhere; AREDS age related eye disease study; ARMD age related macular degeneration; POAG primary open angle glaucoma; EBMD epithelial/anterior basement membrane dystrophy; ACIOL anterior chamber intraocular lens; IOL intraocular lens; PCIOL posterior chamber intraocular lens; Phaco/IOL phacoemulsification with intraocular lens placement; PRK photorefractive keratectomy; LASIK laser assisted in situ keratomileusis; HTN hypertension; DM diabetes mellitus; COPD chronic obstructive pulmonary disease ? ?

## 2021-12-11 ENCOUNTER — Encounter (INDEPENDENT_AMBULATORY_CARE_PROVIDER_SITE_OTHER): Payer: 59 | Admitting: Ophthalmology

## 2021-12-11 ENCOUNTER — Encounter (INDEPENDENT_AMBULATORY_CARE_PROVIDER_SITE_OTHER): Payer: Self-pay

## 2021-12-18 NOTE — Progress Notes (Shared)
?[facility] - Clinic Note ? ?12/22/2021 ? ?  ? ?CHIEF COMPLAINT ?Patient presents for No chief complaint on file. ? ? ?HISTORY OF PRESENT ILLNESS: ?Gabriella Norman is a 66 y.o. female who presents to the clinic today for:  ? ? ? ?Referring physician: ?Evangeline Dakin, PA  ?[address]  ?[place], [place] [address] ? ?HISTORICAL INFORMATION:  ? ?Selected notes from the MEDICAL RECORD NUMBER ?Referred by Dr. Hyacinth Meeker ?PMH- Diabetic  ?  ? ?CURRENT MEDICATIONS: ?No current outpatient medications on file. (Ophthalmic Drugs)  ? ?No current facility-administered medications for this visit. (Ophthalmic Drugs)  ? ?Current Outpatient Medications (Other)  ?Medication Sig  ? benzonatate (TESSALON) 200 MG capsule Take 1 capsule by mouth three times daily as needed for cough  ? famotidine (PEPCID) 20 MG tablet One at bedtime  ? losartan-hydrochlorothiazide (HYZAAR) 50-12.5 MG tablet 1 (one) time each day at the same time.  ? metFORMIN (GLUCOPHAGE) 500 MG tablet TAKE 1 TABLET BY MOUTH TWICE DAILY FOR 90 DAYS  ? pantoprazole (PROTONIX) 40 MG tablet TAKE 1 TABLET BY MOUTH ONCE DAILY(TAKE 30-60 MINUTES BEFORE FIRST MEAL OF THE DAY)  ? rosuvastatin (CRESTOR) 20 MG tablet Take 20 mg by mouth daily.  ? ?No current facility-administered medications for this visit. (Other)  ? ? ? ? ?REVIEW OF SYSTEMS: ? ? ? ?ALLERGIES ?No Known Allergies ? ?PAST MEDICAL HISTORY ?Past Medical History:  ?Diagnosis Date  ? Asthma   ? DM (diabetes mellitus) (HCC)   ? Hyperlipidemia   ? Hypertension   ? ?No past surgical history on file. ? ?FAMILY HISTORY ?No family history on file. ? ?SOCIAL HISTORY ?Social History  ? ?Tobacco Use  ? Smoking status: Never  ? Smokeless tobacco: Never  ?Vaping Use  ? Vaping Use: Never used  ?Substance Use Topics  ? Alcohol use: Never  ? Drug use: Never  ? ?  ? ?  ? ?OPHTHALMIC EXAM: ? ?Not recorded ?  ? ? ?IMAGING AND PROCEDURES  ?Imaging and Procedures for 12/22/2021 ? ? ? ?  ?  ? ?   ?ASSESSMENT/PLAN: ? ?  ICD-10-CM   ?1. Retinal edema  H35.81   ?  ? ? ?1. ? ?2. ? ?3. ? ?Ophthalmic Meds Ordered this visit:  ?No orders of the defined types were placed in this encounter. ? ? ?  ? ?No follow-ups on file. ? ?There are no Patient Instructions on file for this visit. ? ? ?Explained the diagnoses, plan, and follow up with the patient and they expressed understanding.  Patient expressed understanding of the importance of proper follow up care.  ? ?This document serves as a record of services personally performed by Karie Chimera, MD, PhD. It was created on their behalf by Joni Reining, an ophthalmic technician. The creation of this record is the provider's dictation and/or activities during the visit.   ? ?Electronically signed by: Joni Reining COA, 12/18/21  12:09 PM ? ?Karie Chimera, M.D., Ph.D. ?Diseases & Surgery of the Retina and Vitreous ?[facility] ? ? ? ? ?Abbreviations: ?M myopia (nearsighted); A astigmatism; H hyperopia (farsighted); P presbyopia; Mrx spectacle prescription;  CTL contact lenses; OD right eye; OS left eye; OU both eyes  XT exotropia; ET esotropia; PEK punctate epithelial keratitis; PEE punctate epithelial erosions; DES dry eye syndrome; MGD meibomian gland dysfunction; ATs artificial tears; PFAT's preservative free artificial tears; NSC nuclear sclerotic cataract; PSC posterior subcapsular cataract; ERM epi-retinal membrane;  PVD posterior vitreous detachment; RD retinal detachment; DM diabetes mellitus; DR diabetic retinopathy; NPDR non-proliferative diabetic retinopathy; PDR proliferative diabetic retinopathy; CSME clinically significant macular edema; DME diabetic macular edema; dbh dot blot hemorrhages; CWS cotton wool spot; POAG primary open angle glaucoma; C/D cup-to-disc ratio; HVF humphrey visual field; GVF goldmann visual field; OCT optical coherence tomography; IOP intraocular pressure; BRVO Branch retinal vein occlusion; CRVO central  retinal vein occlusion; CRAO central retinal artery occlusion; BRAO branch retinal artery occlusion; RT retinal tear; SB scleral buckle; PPV pars plana vitrectomy; VH Vitreous hemorrhage; PRP panretinal laser photocoagulation; IVK intravitreal kenalog; VMT vitreomacular traction; MH Macular hole;  NVD neovascularization of the disc; NVE neovascularization elsewhere; AREDS age related eye disease study; ARMD age related macular degeneration; POAG primary open angle glaucoma; EBMD epithelial/anterior basement membrane dystrophy; ACIOL anterior chamber intraocular lens; IOL intraocular lens; PCIOL posterior chamber intraocular lens; Phaco/IOL phacoemulsification with intraocular lens placement; PRK photorefractive keratectomy; LASIK laser assisted in situ keratomileusis; HTN hypertension; DM diabetes mellitus; COPD chronic obstructive pulmonary disease ? ?

## 2021-12-22 ENCOUNTER — Encounter (INDEPENDENT_AMBULATORY_CARE_PROVIDER_SITE_OTHER): Payer: 59 | Admitting: Ophthalmology

## 2021-12-22 ENCOUNTER — Encounter (INDEPENDENT_AMBULATORY_CARE_PROVIDER_SITE_OTHER): Payer: Self-pay

## 2021-12-22 DIAGNOSIS — H3581 Retinal edema: Secondary | ICD-10-CM

## 2022-01-02 ENCOUNTER — Ambulatory Visit: Payer: 59 | Admitting: Orthopaedic Surgery

## 2022-01-02 ENCOUNTER — Encounter: Payer: Self-pay | Admitting: Orthopaedic Surgery

## 2022-01-02 DIAGNOSIS — G8929 Other chronic pain: Secondary | ICD-10-CM | POA: Diagnosis not present

## 2022-01-02 DIAGNOSIS — M25512 Pain in left shoulder: Secondary | ICD-10-CM

## 2022-01-02 NOTE — Progress Notes (Signed)
? ?  Office Visit Note ?  ?Patient: Shalanda Brogden           ?Date of Birth: 09-Feb-1956           ?MRN: [id number] ?Visit Date: 01/02/2022 ?             ?Requested by: Collene Mares, PA ?[id number] [place] ?[address] ?[place],  [place] [address] ?PCP: Collene Mares, PA ? ? ?Assessment & Plan: ?Visit Diagnoses:  ?1. Chronic left shoulder pain   ? ? ?Plan: Impression is chronic left shoulder pain.  At this point Eunice Blase has already been in contact with the patient to schedule surgery.  Patient would like to obtain a new hemoglobin A1c today.  We will call her with the results.  Follow-up as indicated postoperatively. ? ?Follow-Up Instructions: Return for post-op.  ? ?Orders:  ?No orders of the defined types were placed in this encounter. ? ?No orders of the defined types were placed in this encounter. ? ? ? ? Procedures: ?No procedures performed ? ? ?Clinical Data: ?No additional findings. ? ? ?Subjective: ?Chief Complaint  ?Patient presents with  ? Left Shoulder - Follow-up  ? ? ?HPI patient is a pleasant 66 year old female who comes in today with chronic left shoulder pain.  She was seen in our office about 3 months ago for this where it was recommended that she undergo arthroscopic debridement, biceps tenotomy and subacromial decompression with possible rotator cuff repair.  She has been working on getting her hemoglobin A1c down over the past few months.  She is also ready to proceed with surgery if able. ? ? ? ? ?Objective: ?Vital Signs: There were no vitals taken for this visit. ? ? ? ?Ortho Exam unchanged left shoulder exam ? ?Specialty Comments:  ?No specialty comments available. ? ?Imaging: ?No new imaging ? ? ?PMFS History: ?Patient Active Problem List  ? Diagnosis Date Noted  ? Superior labrum anterior-to-posterior (SLAP) tear of right shoulder 09/29/2021  ? Mild cognitive impairment 07/06/2021  ? Memory loss 04/03/2021  ? Pure hypercholesterolemia 04/23/2019  ? Type 2 diabetes mellitus without  complication, without long-term current use of insulin (HCC) 04/23/2019  ? Benign essential HTN 04/17/2019  ? Cough variant asthma vs UACS 09/23/2018  ? ?Past Medical History:  ?Diagnosis Date  ? Asthma   ? DM (diabetes mellitus) (HCC)   ? Hyperlipidemia   ? Hypertension   ?  ?No family history on file.  ?History reviewed. No pertinent surgical history. ?Social History  ? ?Occupational History  ? Not on file  ?Tobacco Use  ? Smoking status: Never  ? Smokeless tobacco: Never  ?Vaping Use  ? Vaping Use: Never used  ?Substance and Sexual Activity  ? Alcohol use: Never  ? Drug use: Never  ? Sexual activity: Not on file  ? ? ? ? ? ? ?

## 2022-01-03 LAB — HEMOGLOBIN A1C
Hgb A1c MFr Bld: 7.6 % of total Hgb — ABNORMAL HIGH (ref <5.7)
Mean Plasma Glucose: 171 mg/dL
eAG (mmol/L): 9.5 mmol/L

## 2022-01-04 NOTE — Progress Notes (Signed)
?[facility] - Clinic Note ? ?01/09/2022 ? ?  ? ?CHIEF COMPLAINT ?Patient presents for Retina Evaluation ? ? ?HISTORY OF PRESENT ILLNESS: ?Gabriella Norman is a 66 y.o. female who presents to the clinic today for:  ? ?HPI   ? ? Retina Evaluation   ?In both eyes.  I, the attending physician,  performed the HPI with the patient and updated documentation appropriately. ? ?  ?  ? ? Comments   ?Patient here for Retina Evaluation. Referred by Dr Hyacinth Meeker. Patient states vision doing good. No eye pain.  ? ?  ?  ?Last edited by Rennis Chris, MD on 01/10/2022  6:17 PM.  ?  ?Pt is here on the referral of [place], [place] for DM exam, pts daughter states pt was dx pre-diabetic and on a small dose of metformin until January when she was checked and her A1c was 9.2, her metformin dosage was increased in January, pt had her A1c checked last week and it is down to 7.6, pt has also lost weight, which has helped the A1c as well, pt injured her arm and has to have sx, pt is not having any problems with her vision ? ?Referring physician: ?Collene Mares, PA ?[address] ?[address] ?[place],  [place] [address] ? ?HISTORICAL INFORMATION:  ? ?Selected notes from the MEDICAL RECORD NUMBER ?Referred by Evangeline Dakin, PA ?LEE:  ?Ocular Hx- ?PMH- ?  ? ?CURRENT MEDICATIONS: ?No current outpatient medications on file. (Ophthalmic Drugs)  ? ?No current facility-administered medications for this visit. (Ophthalmic Drugs)  ? ?Current Outpatient Medications (Other)  ?Medication Sig  ? famotidine (PEPCID) 20 MG tablet One at bedtime  ? losartan-hydrochlorothiazide (HYZAAR) 50-12.5 MG tablet 1 (one) time each day at the same time.  ? metFORMIN (GLUCOPHAGE) 500 MG tablet TAKE 1 TABLET BY MOUTH TWICE DAILY FOR 90 DAYS  ? pantoprazole (PROTONIX) 40 MG tablet TAKE 1 TABLET BY MOUTH ONCE DAILY(TAKE 30-60 MINUTES BEFORE FIRST MEAL OF THE DAY)  ? rosuvastatin (CRESTOR) 20 MG tablet Take 20 mg by mouth daily.  ? benzonatate  (TESSALON) 200 MG capsule Take 1 capsule by mouth three times daily as needed for cough (Patient not taking: Reported on 01/09/2022)  ? ?No current facility-administered medications for this visit. (Other)  ? ?REVIEW OF SYSTEMS: ?ROS   ?Positive for: Endocrine, Eyes ?Last edited by Laddie Aquas, COA on 01/09/2022  2:19 PM.  ?  ? ?ALLERGIES ?No Known Allergies ? ?PAST MEDICAL HISTORY ?Past Medical History:  ?Diagnosis Date  ? Asthma   ? DM (diabetes mellitus) (HCC)   ? Hyperlipidemia   ? Hypertension   ? ?History reviewed. No pertinent surgical history. ? ?FAMILY HISTORY ?History reviewed. No pertinent family history. ? ?SOCIAL HISTORY ?Social History  ? ?Tobacco Use  ? Smoking status: Never  ? Smokeless tobacco: Never  ?Vaping Use  ? Vaping Use: Never used  ?Substance Use Topics  ? Alcohol use: Never  ? Drug use: Never  ?  ? ?  ?OPHTHALMIC EXAM: ? ?Base Eye Exam   ? ? Visual Acuity (Snellen - Linear)   ? ?   Right Left  ? Dist Orick 20/20 -1 20/20 -2  ? ?  ?  ? ? Tonometry (Tonopen, 2:17 PM)   ? ?   Right Left  ? Pressure 17 17  ? ?  ?  ? ? Pupils   ? ?   Dark Light Shape React APD  ? Right 3 2  Round Brisk None  ? Left 3 2 Round Brisk None  ? ?  ?  ? ? Visual Fields (Counting fingers)   ? ?   Left Right  ?  Full Full  ? ?  ?  ? ? Extraocular Movement   ? ?   Right Left  ?  Full, Ortho Full, Ortho  ? ?  ?  ? ? Neuro/Psych   ? ? Oriented x3: Yes  ? Mood/Affect: Normal  ? ?  ?  ? ? Dilation   ? ? Both eyes: 1.0% Mydriacyl, 2.5% Phenylephrine @ 2:17 PM  ? ?  ?  ? ?  ? ?Slit Lamp and Fundus Exam   ? ? Slit Lamp Exam   ? ?   Right Left  ? Lids/Lashes Dermatochalasis - upper lid, Meibomian gland dysfunction Dermatochalasis - upper lid, Meibomian gland dysfunction  ? Conjunctiva/Sclera White and quiet White and quiet  ? Cornea mild arcus, trace PEE mild arcus, trace PEE  ? Anterior Chamber Deep and quiet Deep and quiet  ? Iris Round and dilated, No NVI Round and dilated, No NVI  ? Lens 2+ Nuclear sclerosis, 2+ Cortical  cataract 2+ Nuclear sclerosis, 2+ Cortical cataract  ? Anterior Vitreous syneresis, Asteroid hyalosis syneresis, dense asteroid hyalosis  ? ?  ?  ? ? Fundus Exam   ? ?   Right Left  ? Disc Pink and Sharp, mild PPP obscured by dense asteroid, Pink and Sharp  ? C/D Ratio 0.6 0.4  ? Macula Flat, Blunted foveal reflex, mild RPE mottling, No heme or edema Hazy view from asteroid, grossly flat, No heme or edema  ? Vessels mild attenuation mild attenuation, mild tortuosity  ? Periphery Attached, No heme Attached  ? ?  ?  ? ?  ? ?Refraction   ? ? Manifest Refraction   ? ?   Sphere Cylinder Dist VA  ? Right +0.25 Sphere 20/20  ? Left +0.25 Sphere 20/20  ? ?  ?  ? ?  ? ? ?IMAGING AND PROCEDURES  ?Imaging and Procedures for 01/09/2022 ? ?OCT, Retina - OU - Both Eyes   ? ?   ?Right Eye ?Quality was good. Central Foveal Thickness: 269. Progression has no prior data. Findings include normal foveal contour, no IRF, no SRF (+vitreous opacities).  ? ?Left Eye ?Quality was good. Central Foveal Thickness: 271. Progression has no prior data. Findings include normal foveal contour, no IRF, no SRF, vitreomacular adhesion (Dense vitreous opacities).  ? ?Notes ?*Images captured and stored on drive ? ?Diagnosis / Impression:  ?NFP, no IRF/SRF OU ?+vitreous opacities OU ? ?Clinical management:  ?See below ? ?Abbreviations: NFP - Normal foveal profile. CME - cystoid macular edema. PED - pigment epithelial detachment. IRF - intraretinal fluid. SRF - subretinal fluid. EZ - ellipsoid zone. ERM - epiretinal membrane. ORA - outer retinal atrophy. ORT - outer retinal tubulation. SRHM - subretinal hyper-reflective material. IRHM - intraretinal hyper-reflective material ? ? ?  ? ?Fluorescein Angiography Optos (Transit OS)   ? ?   ?Right Eye ?Progression has no prior data. Early phase findings include blockage, microaneurysm. Mid/Late phase findings include blockage, microaneurysm.  ? ?Left Eye ?Progression has no prior data. Early phase findings  include blockage. Mid/Late phase findings include blockage (No significant MA, diffuse punctate blockage from severe astroid).  ? ?Notes ?**Images stored on drive** ? ?Impression: ?Asteroid Hyalosis OU (OS > OD) ?- No significant MA or retinopathy OU -- no leakage ?- diffuse punctate blockage  from severe asteroid ? ?  ? ? ?  ?  ? ?  ?ASSESSMENT/PLAN: ? ?  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  ?2. Diabetes mellitus type 2 without retinopathy (HCC)  E11.9   ?  ?3. Essential hypertension  I10   ?  ?4. Hypertensive retinopathy of both eyes  H35.033 OCT, Retina - OU - Both Eyes  ?  Fluorescein Angiography Optos (Transit OS)  ?  ?5. Combined forms of age-related cataract of both eyes  H25.813   ?  ? ? ?1. 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 - severe asteroid OU ? - BCVA 20/20 OU and pt asymptomatic ? - discussed findings, prognosis ?- no retinal or ophthalmic interventions indicated or recommended  ? - monitor ? ?2. Diabetes mellitus, type 2 without retinopathy ? - pt recent worsening of BG due to decreased activity following injury ? - last A1c 7.6% on 4.18.23 from 9.2 on 1.13.23 ?- The incidence, risk factors for progression, natural history and treatment options for diabetic retinopathy  were discussed with patient.   ?- The need for close monitoring of blood glucose, blood pressure, and serum lipids, avoiding cigarette or any type of tobacco, and the need for long term follow up was also discussed with patient. ?- OCT w/o DME ?- FA 4.26.23 shows no significant MA OU, no NV OU ?- f/u in 1 year, sooner prn ? ?3,4. Hypertensive retinopathy OU ?- discussed importance of tight BP control ?- monitor ? ?5. Mixed Cataract OU ?- The symptoms of cataract, surgical options, and treatments and risks were discussed with patient. ?- discussed diagnosis and progression ?- not yet visually significant ?- monitor ? ?Ophthalmic Meds Ordered this visit:  ?No orders of the defined types were placed in this encounter. ?   ? ?Return in about 1 year (around 01/10/2023) for f/u DM exam, DFE, OCT. ? ?There are no Patient Instructions on file for this visit. ? ? ?Explained the diagnoses, plan, and follow up with the patient and they expres

## 2022-01-09 ENCOUNTER — Ambulatory Visit (INDEPENDENT_AMBULATORY_CARE_PROVIDER_SITE_OTHER): Payer: 59 | Admitting: Ophthalmology

## 2022-01-09 ENCOUNTER — Encounter (INDEPENDENT_AMBULATORY_CARE_PROVIDER_SITE_OTHER): Payer: Self-pay | Admitting: Ophthalmology

## 2022-01-09 DIAGNOSIS — H35033 Hypertensive retinopathy, bilateral: Secondary | ICD-10-CM | POA: Diagnosis not present

## 2022-01-09 DIAGNOSIS — E119 Type 2 diabetes mellitus without complications: Secondary | ICD-10-CM

## 2022-01-09 DIAGNOSIS — I10 Essential (primary) hypertension: Secondary | ICD-10-CM | POA: Diagnosis not present

## 2022-01-09 DIAGNOSIS — H4323 Crystalline deposits in vitreous body, bilateral: Secondary | ICD-10-CM

## 2022-01-09 DIAGNOSIS — H25813 Combined forms of age-related cataract, bilateral: Secondary | ICD-10-CM

## 2022-01-10 ENCOUNTER — Encounter (INDEPENDENT_AMBULATORY_CARE_PROVIDER_SITE_OTHER): Payer: Self-pay | Admitting: Ophthalmology

## 2022-02-01 ENCOUNTER — Other Ambulatory Visit: Payer: Self-pay

## 2022-02-01 ENCOUNTER — Encounter (HOSPITAL_BASED_OUTPATIENT_CLINIC_OR_DEPARTMENT_OTHER): Payer: Self-pay | Admitting: Orthopaedic Surgery

## 2022-02-08 NOTE — Progress Notes (Signed)
Sent text message reminding pt to come in for lab work and EKG.  

## 2022-02-08 NOTE — Discharge Instructions (Addendum)
? ? ?Post-operative patient instructions  ?Shoulder Arthroscopy  ? ?Ice:  Place intermittent ice or cooler pack over your shoulder, 30 minutes on and 30 minutes off.  Continue this for the first 72 hours after surgery, then save ice for use after therapy sessions or on more active days.   ?Weight:  You may bear weight on your arm as your symptoms allow. ?Motion:  Perform gentle shoulder motion as tolerated ?Dressing:  Perform 1st dressing change at 2 days postoperative. A moderate amount of blood tinged drainage is to be expected.  So if you bleed through the dressing on the first or second day or if you have fevers, it is fine to change the dressing/check the wounds early and redress wound.  If it bleeds through again, or if the incisions are leaking frank blood, please call the office. May change dressing every 1-2 days thereafter to help watch wounds. Can purchase Tegaderm (or 3M Nexcare) water resistant dressings at local pharmacy / [employer]. ?Shower:  Light shower is ok after 2 days.  Please take shower, NO bath. Recover with gauze and ace wrap to help keep wounds protected.   ?Pain medication:  A narcotic pain medication has been prescribed.  Take as directed.  Typically you need narcotic pain medication more regularly during the first 3 to 5 days after surgery.  Decrease your use of the medication as the pain improves.  Narcotics can sometimes cause constipation, even after a few doses.  If you have problems with constipation, you can take an over the counter stool softener or light laxative.  If you have persistent problems, please notify your physician?s office. ?Physical therapy: Additional activity guidelines to be provided by your physician or physical therapist at follow-up visits.  ?Driving: Do not recommend driving x 2 weeks post surgical, especially if surgery performed on right side. Should not drive while taking narcotic pain medications. It typically takes at least 2 weeks to restore sufficient  neuromuscular function for normal reaction times for driving safety.  ?Call [phone] for questions or problems. Evenings you will be forwarded to the hospital operator.  Ask for the orthopaedic physician on call. Please call if you experience:  ?  ?Redness, foul smelling, or persistent drainage from the surgical site  ?worsening shoulder pain and swelling not responsive to medication  ?any calf pain and or swelling of the lower leg  ?temperatures greater than 101.5 F ?other questions or concerns ? ? ?Thank you for allowing us to be a part of your care. ? ? ?Post Anesthesia Home Care Instructions ? ?Activity: ?Get plenty of rest for the remainder of the day. A responsible individual must stay with you for 24 hours following the procedure.  ?For the next 24 hours, DO NOT: ?-Drive a car ?-Operate machinery ?-Drink alcoholic beverages ?-Take any medication unless instructed by your physician ?-Make any legal decisions or sign important papers. ? ?Meals: ?Start with liquid foods such as gelatin or soup. Progress to regular foods as tolerated. Avoid greasy, spicy, heavy foods. If nausea and/or vomiting occur, drink only clear liquids until the nausea and/or vomiting subsides. Call your physician if vomiting continues. ? ?Special Instructions/Symptoms: ?Your throat may feel dry or sore from the anesthesia or the breathing tube placed in your throat during surgery. If this causes discomfort, gargle with warm salt water. The discomfort should disappear within 24 hours. ? ?If you had a scopolamine patch placed behind your ear for the management of post- operative nausea and/or vomiting: ? ?1.   The medication in the patch is effective for 72 hours, after which it should be removed.  Wrap patch in a tissue and discard in the trash. Wash hands thoroughly with soap and water. ?2. You may remove the patch earlier than 72 hours if you experience unpleasant side effects which may include dry mouth, dizziness or visual  disturbances. ?3. Avoid touching the patch. Wash your hands with soap and water after contact with the patch. ?   Regional Anesthesia Blocks ? ?1. Numbness or the inability to move the "blocked" extremity may last from 3-48 hours after placement. The length of time depends on the medication injected and your individual response to the medication. If the numbness is not going away after 48 hours, call your surgeon. ? ?2. The extremity that is blocked will need to be protected until the numbness is gone and the  Strength has returned. Because you cannot feel it, you will need to take extra care to avoid injury. Because it may be weak, you may have difficulty moving it or using it. You may not know what position it is in without looking at it while the block is in effect. ? ?3. For blocks in the legs and feet, returning to weight bearing and walking needs to be done carefully. You will need to wait until the numbness is entirely gone and the strength has returned. You should be able to move your leg and foot normally before you try and bear weight or walk. You will need someone to be with you when you first try to ensure you do not fall and possibly risk injury. ? ?4. Bruising and tenderness at the needle site are common side effects and will resolve in a few days. ? ?5. Persistent numbness or new problems with movement should be communicated to the surgeon or the [facility] ([phone])/ [facility] ([phone]). Information for Discharge Teaching: ?EXPAREL (bupivacaine liposome injectable suspension)  ? ?Your surgeon or anesthesiologist gave you EXPAREL(bupivacaine) to help control your pain after surgery.  ?EXPAREL is a local anesthetic that provides pain relief by numbing the tissue around the surgical site. ?EXPAREL is designed to release pain medication over time and can control pain for up to 72 hours. ?Depending on how you respond to EXPAREL, you may require less pain medication  during your recovery. ? ?Possible side effects: ?Temporary loss of sensation or ability to move in the area where bupivacaine was injected. ?Nausea, vomiting, constipation ?Rarely, numbness and tingling in your mouth or lips, lightheadedness, or anxiety may occur. ?Call your doctor right away if you think you may be experiencing any of these sensations, or if you have other questions regarding possible side effects. ? ?Follow all other discharge instructions given to you by your surgeon or nurse. Eat a healthy diet and drink plenty of water or other fluids. ? ?If you return to the hospital for any reason within 96 hours following the administration of EXPAREL, it is important for health care providers to know that you have received this anesthetic. A teal colored band has been placed on your arm with the date, time and amount of EXPAREL you have received in order to alert and inform your health care providers. Please leave this armband in place for the full 96 hours following administration, and then you may remove the band.  ?

## 2022-02-09 ENCOUNTER — Encounter (HOSPITAL_BASED_OUTPATIENT_CLINIC_OR_DEPARTMENT_OTHER)
Admission: RE | Disposition: A | Discharge status: Home / Self Care | Payer: Self-pay | Source: Home / Self Care | Attending: Orthopaedic Surgery

## 2022-02-09 ENCOUNTER — Other Ambulatory Visit: Payer: Self-pay

## 2022-02-09 ENCOUNTER — Ambulatory Visit (HOSPITAL_BASED_OUTPATIENT_CLINIC_OR_DEPARTMENT_OTHER)
Admission: RE | Admit: 2022-02-09 | Discharge: 2022-02-09 | Disposition: A | Discharge status: Home / Self Care | Payer: 59 | Attending: Orthopaedic Surgery | Admitting: Orthopaedic Surgery

## 2022-02-09 ENCOUNTER — Ambulatory Visit (HOSPITAL_BASED_OUTPATIENT_CLINIC_OR_DEPARTMENT_OTHER): Payer: 59 | Admitting: Anesthesiology

## 2022-02-09 ENCOUNTER — Encounter (HOSPITAL_BASED_OUTPATIENT_CLINIC_OR_DEPARTMENT_OTHER): Payer: Self-pay | Admitting: Orthopaedic Surgery

## 2022-02-09 ENCOUNTER — Encounter: Payer: Self-pay | Admitting: Orthopaedic Surgery

## 2022-02-09 DIAGNOSIS — S46012A Strain of muscle(s) and tendon(s) of the rotator cuff of left shoulder, initial encounter: Secondary | ICD-10-CM | POA: Insufficient documentation

## 2022-02-09 DIAGNOSIS — Z79899 Other long term (current) drug therapy: Secondary | ICD-10-CM | POA: Diagnosis not present

## 2022-02-09 DIAGNOSIS — M7582 Other shoulder lesions, left shoulder: Secondary | ICD-10-CM | POA: Diagnosis not present

## 2022-02-09 DIAGNOSIS — X58XXXA Exposure to other specified factors, initial encounter: Secondary | ICD-10-CM | POA: Insufficient documentation

## 2022-02-09 DIAGNOSIS — S43431A Superior glenoid labrum lesion of right shoulder, initial encounter: Secondary | ICD-10-CM | POA: Diagnosis not present

## 2022-02-09 DIAGNOSIS — M25812 Other specified joint disorders, left shoulder: Secondary | ICD-10-CM | POA: Insufficient documentation

## 2022-02-09 DIAGNOSIS — E7800 Pure hypercholesterolemia, unspecified: Secondary | ICD-10-CM

## 2022-02-09 DIAGNOSIS — S43432A Superior glenoid labrum lesion of left shoulder, initial encounter: Secondary | ICD-10-CM

## 2022-02-09 DIAGNOSIS — M67814 Other specified disorders of tendon, left shoulder: Secondary | ICD-10-CM | POA: Insufficient documentation

## 2022-02-09 DIAGNOSIS — Z7984 Long term (current) use of oral hypoglycemic drugs: Secondary | ICD-10-CM | POA: Insufficient documentation

## 2022-02-09 DIAGNOSIS — M659 Synovitis and tenosynovitis, unspecified: Secondary | ICD-10-CM | POA: Diagnosis not present

## 2022-02-09 DIAGNOSIS — M7522 Bicipital tendinitis, left shoulder: Secondary | ICD-10-CM

## 2022-02-09 DIAGNOSIS — E119 Type 2 diabetes mellitus without complications: Secondary | ICD-10-CM | POA: Diagnosis not present

## 2022-02-09 DIAGNOSIS — I10 Essential (primary) hypertension: Secondary | ICD-10-CM | POA: Diagnosis not present

## 2022-02-09 DIAGNOSIS — Z01818 Encounter for other preprocedural examination: Secondary | ICD-10-CM

## 2022-02-09 DIAGNOSIS — J45909 Unspecified asthma, uncomplicated: Secondary | ICD-10-CM | POA: Diagnosis not present

## 2022-02-09 DIAGNOSIS — M7542 Impingement syndrome of left shoulder: Secondary | ICD-10-CM | POA: Diagnosis not present

## 2022-02-09 HISTORY — PX: SHOULDER ARTHROSCOPY WITH ROTATOR CUFF REPAIR AND SUBACROMIAL DECOMPRESSION: SHX5686

## 2022-02-09 LAB — BASIC METABOLIC PANEL
Anion gap: 6 (ref 5–15)
BUN: 12 mg/dL (ref 8–23)
CO2: 29 mmol/L (ref 22–32)
Calcium: 9.7 mg/dL (ref 8.9–10.3)
Chloride: 104 mmol/L (ref 98–111)
Creatinine, Ser: 0.78 mg/dL (ref 0.44–1.00)
GFR, Estimated: >60 mL/min (ref >60)
Glucose, Bld: 144 mg/dL — ABNORMAL HIGH (ref 70–99)
Potassium: 3.7 mmol/L (ref 3.5–5.1)
Sodium: 139 mmol/L (ref 135–145)

## 2022-02-09 LAB — GLUCOSE, CAPILLARY
Glucose-Capillary: 143 mg/dL — ABNORMAL HIGH (ref 70–99)
Glucose-Capillary: 186 mg/dL — ABNORMAL HIGH (ref 70–99)

## 2022-02-09 SURGERY — SHOULDER ARTHROSCOPY WITH ROTATOR CUFF REPAIR AND SUBACROMIAL DECOMPRESSION
Anesthesia: General | Site: Shoulder | Laterality: Left

## 2022-02-09 MED ORDER — ROCURONIUM BROMIDE 10 MG/ML (PF) SYRINGE
PREFILLED_SYRINGE | INTRAVENOUS | Status: AC
  Filled 2022-02-09: qty 10

## 2022-02-09 MED ORDER — SUGAMMADEX SODIUM 200 MG/2ML IV SOLN
INTRAVENOUS | Status: DC | PRN
  Administered 2022-02-09: 200 mg via INTRAVENOUS

## 2022-02-09 MED ORDER — KETOROLAC TROMETHAMINE 30 MG/ML IJ SOLN: 30.0000 mg | Freq: Once | INTRAMUSCULAR | Status: DC | PRN

## 2022-02-09 MED ORDER — OXYCODONE-ACETAMINOPHEN 5-325 MG PO TABS: 1.0000 | ORAL_TABLET | Freq: Two times a day (BID) | ORAL | 0 refills | Status: DC | PRN

## 2022-02-09 MED ORDER — CEFAZOLIN SODIUM-DEXTROSE 2-4 GM/100ML-% IV SOLN
2.0000 g | INTRAVENOUS | Status: AC
  Administered 2022-02-09: 2 g via INTRAVENOUS

## 2022-02-09 MED ORDER — DEXAMETHASONE SODIUM PHOSPHATE 10 MG/ML IJ SOLN
INTRAMUSCULAR | Status: AC
  Filled 2022-02-09: qty 1

## 2022-02-09 MED ORDER — MIDAZOLAM HCL 2 MG/2ML IJ SOLN
INTRAMUSCULAR | Status: AC
  Filled 2022-02-09: qty 2

## 2022-02-09 MED ORDER — LACTATED RINGERS IV SOLN: INTRAVENOUS | Status: DC

## 2022-02-09 MED ORDER — BUPIVACAINE-EPINEPHRINE (PF) 0.25% -1:200000 IJ SOLN
INTRAMUSCULAR | Status: AC
  Filled 2022-02-09: qty 30

## 2022-02-09 MED ORDER — PROPOFOL 500 MG/50ML IV EMUL
INTRAVENOUS | Status: AC
  Filled 2022-02-09: qty 50

## 2022-02-09 MED ORDER — FENTANYL CITRATE (PF) 100 MCG/2ML IJ SOLN
INTRAMUSCULAR | Status: DC | PRN
  Administered 2022-02-09: 50 ug via INTRAVENOUS

## 2022-02-09 MED ORDER — ROCURONIUM BROMIDE 100 MG/10ML IV SOLN
INTRAVENOUS | Status: DC | PRN
  Administered 2022-02-09: 70 mg via INTRAVENOUS

## 2022-02-09 MED ORDER — DEXAMETHASONE SODIUM PHOSPHATE 4 MG/ML IJ SOLN
INTRAMUSCULAR | Status: DC | PRN
  Administered 2022-02-09: 4 mg via INTRAVENOUS

## 2022-02-09 MED ORDER — EPHEDRINE SULFATE (PRESSORS) 50 MG/ML IJ SOLN
INTRAMUSCULAR | Status: DC | PRN
  Administered 2022-02-09: 10 mg via INTRAVENOUS

## 2022-02-09 MED ORDER — PHENYLEPHRINE HCL-NACL 20-0.9 MG/250ML-% IV SOLN
INTRAVENOUS | Status: DC | PRN
  Administered 2022-02-09: 50 ug/min via INTRAVENOUS

## 2022-02-09 MED ORDER — OXYCODONE HCL 5 MG/5ML PO SOLN: 5.0000 mg | Freq: Once | ORAL | Status: DC | PRN

## 2022-02-09 MED ORDER — BUPIVACAINE HCL (PF) 0.5 % IJ SOLN
INTRAMUSCULAR | Status: DC | PRN
  Administered 2022-02-09: 15 mL via PERINEURAL

## 2022-02-09 MED ORDER — ONDANSETRON HCL 4 MG/2ML IJ SOLN
INTRAMUSCULAR | Status: AC
Start: 2022-02-09 — End: ?
  Filled 2022-02-09: qty 2

## 2022-02-09 MED ORDER — FENTANYL CITRATE (PF) 100 MCG/2ML IJ SOLN
50.0000 ug | Freq: Once | INTRAMUSCULAR | Status: AC
  Administered 2022-02-09: 50 ug via INTRAVENOUS

## 2022-02-09 MED ORDER — FENTANYL CITRATE (PF) 100 MCG/2ML IJ SOLN
INTRAMUSCULAR | Status: AC
  Filled 2022-02-09: qty 2

## 2022-02-09 MED ORDER — LIDOCAINE 2% (20 MG/ML) 5 ML SYRINGE
INTRAMUSCULAR | Status: AC
  Filled 2022-02-09: qty 5

## 2022-02-09 MED ORDER — PHENYLEPHRINE HCL (PRESSORS) 10 MG/ML IV SOLN
INTRAVENOUS | Status: DC | PRN
  Administered 2022-02-09 (×2): 80 ug via INTRAVENOUS

## 2022-02-09 MED ORDER — PROPOFOL 10 MG/ML IV BOLUS
INTRAVENOUS | Status: DC | PRN
  Administered 2022-02-09: 50 mg via INTRAVENOUS
  Administered 2022-02-09: 150 mg via INTRAVENOUS

## 2022-02-09 MED ORDER — ONDANSETRON HCL 4 MG/2ML IJ SOLN
INTRAMUSCULAR | Status: DC | PRN
  Administered 2022-02-09: 4 mg via INTRAVENOUS

## 2022-02-09 MED ORDER — PHENYLEPHRINE HCL (PRESSORS) 10 MG/ML IV SOLN
INTRAVENOUS | Status: AC
  Filled 2022-02-09: qty 1

## 2022-02-09 MED ORDER — ONDANSETRON HCL 4 MG/2ML IJ SOLN: 4.0000 mg | Freq: Once | INTRAMUSCULAR | Status: DC | PRN

## 2022-02-09 MED ORDER — LIDOCAINE HCL (CARDIAC) PF 100 MG/5ML IV SOSY
PREFILLED_SYRINGE | INTRAVENOUS | Status: DC | PRN
Start: 2022-02-09 — End: 2022-02-09
  Administered 2022-02-09: 100 mg via INTRAVENOUS

## 2022-02-09 MED ORDER — OXYCODONE HCL 5 MG PO TABS: 5.0000 mg | ORAL_TABLET | Freq: Once | ORAL | Status: DC | PRN

## 2022-02-09 MED ORDER — FENTANYL CITRATE (PF) 100 MCG/2ML IJ SOLN: 25.0000 ug | INTRAMUSCULAR | Status: DC | PRN

## 2022-02-09 MED ORDER — MIDAZOLAM HCL 2 MG/2ML IJ SOLN
2.0000 mg | Freq: Once | INTRAMUSCULAR | Status: AC
Start: 2022-02-09 — End: 2022-02-09
  Administered 2022-02-09: 2 mg via INTRAVENOUS

## 2022-02-09 MED ORDER — CEFAZOLIN SODIUM-DEXTROSE 2-4 GM/100ML-% IV SOLN
INTRAVENOUS | Status: AC
  Filled 2022-02-09: qty 100

## 2022-02-09 MED ORDER — BUPIVACAINE LIPOSOME 1.3 % IJ SUSP
INTRAMUSCULAR | Status: DC | PRN
  Administered 2022-02-09: 10 mL via PERINEURAL

## 2022-02-09 SURGICAL SUPPLY — 60 items
BENZOIN TINCTURE PRP APPL 2/3 (GAUZE/BANDAGES/DRESSINGS) IMPLANT
BLADE EXCALIBUR 4.0X13 (MISCELLANEOUS) IMPLANT
BLADE SURG 15 STRL LF DISP TIS (BLADE) IMPLANT
BLADE SURG 15 STRL SS (BLADE)
BURR OVAL 8 FLU 4.0X13 (MISCELLANEOUS) ×2 IMPLANT
CANNULA 5.75X71 LONG (CANNULA) ×2 IMPLANT
CANNULA SHOULDER 7CM (CANNULA) IMPLANT
CANNULA TWIST IN 8.25X7CM (CANNULA) IMPLANT
CLSR STERI-STRIP ANTIMIC 1/2X4 (GAUZE/BANDAGES/DRESSINGS) IMPLANT
COOLER ICEMAN CLASSIC (MISCELLANEOUS) ×2 IMPLANT
DISSECTOR  3.8MM X 13CM (MISCELLANEOUS) ×2
DISSECTOR 3.8MM X 13CM (MISCELLANEOUS) ×1 IMPLANT
DRAPE IMP U-DRAPE 54X76 (DRAPES) ×2 IMPLANT
DRAPE INCISE IOBAN 66X45 STRL (DRAPES) ×2 IMPLANT
DRAPE SHOULDER BEACH CHAIR (DRAPES) ×2 IMPLANT
DRAPE STERI 35X30 U-POUCH (DRAPES) ×2 IMPLANT
DRAPE SURG 17X23 STRL (DRAPES) ×2 IMPLANT
DRAPE U-SHAPE 47X51 STRL (DRAPES) ×2 IMPLANT
DRSG PAD ABDOMINAL 8X10 ST (GAUZE/BANDAGES/DRESSINGS) ×4 IMPLANT
DURAPREP 26ML APPLICATOR (WOUND CARE) ×4 IMPLANT
ELECT REM PT RETURN 9FT ADLT (ELECTROSURGICAL)
ELECTRODE REM PT RTRN 9FT ADLT (ELECTROSURGICAL) IMPLANT
GAUZE SPONGE 4X4 12PLY STRL (GAUZE/BANDAGES/DRESSINGS) ×4 IMPLANT
GAUZE XEROFORM 1X8 LF (GAUZE/BANDAGES/DRESSINGS) ×2 IMPLANT
GLOVE BIOGEL PI IND STRL 7.0 (GLOVE) ×1 IMPLANT
GLOVE BIOGEL PI IND STRL 8 (GLOVE) ×1 IMPLANT
GLOVE BIOGEL PI INDICATOR 7.0 (GLOVE) ×1
GLOVE BIOGEL PI INDICATOR 8 (GLOVE) ×1
GLOVE ECLIPSE 7.0 STRL STRAW (GLOVE) ×4 IMPLANT
GLOVE SURG SYN 7.5  E (GLOVE) ×2
GLOVE SURG SYN 7.5 E (GLOVE) ×1 IMPLANT
GLOVE SURG SYN 7.5 PF PI (GLOVE) ×1 IMPLANT
GOWN STRL REIN XL XLG (GOWN DISPOSABLE) ×4 IMPLANT
GOWN STRL REUS W/ TWL LRG LVL3 (GOWN DISPOSABLE) ×1 IMPLANT
GOWN STRL REUS W/TWL LRG LVL3 (GOWN DISPOSABLE) ×2
MANIFOLD NEPTUNE II (INSTRUMENTS) ×2 IMPLANT
NDL SCORPION MULTI FIRE (NEEDLE) IMPLANT
NEEDLE SCORPION MULTI FIRE (NEEDLE) IMPLANT
PACK ARTHROSCOPY DSU (CUSTOM PROCEDURE TRAY) ×2 IMPLANT
PACK BASIN DAY SURGERY FS (CUSTOM PROCEDURE TRAY) ×2 IMPLANT
PAD COLD SHLDR WRAP-ON (PAD) ×2 IMPLANT
PORT APPOLLO RF 90DEGREE MULTI (SURGICAL WAND) ×2 IMPLANT
SHEET MEDIUM DRAPE 40X70 STRL (DRAPES) IMPLANT
SLEEVE SCD COMPRESS KNEE MED (STOCKING) ×2 IMPLANT
SLING ARM FOAM STRAP LRG (SOFTGOODS) IMPLANT
SPIKE FLUID TRANSFER (MISCELLANEOUS) IMPLANT
SUPPORT WRAP ARM LG (MISCELLANEOUS) ×2 IMPLANT
SUT ETHILON 3 0 PS 1 (SUTURE) ×2 IMPLANT
SUT FIBERWIRE #2 38 T-5 BLUE (SUTURE)
SUT TIGER TAPE 7 IN WHITE (SUTURE) IMPLANT
SUTURE FIBERWR #2 38 T-5 BLUE (SUTURE) IMPLANT
SUTURE TAPE 1.3 40 TPR END (SUTURE) IMPLANT
SUTURE TAPE TIGERLINK 1.3MM BL (SUTURE) IMPLANT
SUTURETAPE 1.3 40 TPR END (SUTURE)
SUTURETAPE TIGERLINK 1.3MM BL (SUTURE)
SYR 50ML LL SCALE MARK (SYRINGE) ×2 IMPLANT
TAPE FIBER 2MM 7IN #2 BLUE (SUTURE) IMPLANT
TOWEL GREEN STERILE FF (TOWEL DISPOSABLE) ×2 IMPLANT
TUBE CONNECTING 20X1/4 (TUBING) ×2 IMPLANT
TUBING ARTHROSCOPY IRRIG 16FT (MISCELLANEOUS) ×2 IMPLANT

## 2022-02-09 NOTE — Anesthesia Procedure Notes (Signed)
Procedure Name: Intubation Date/Time: 02/09/2022 7:38 AM Performed by: Maryella Shivers, CRNA Pre-anesthesia Checklist: Patient identified, Emergency Drugs available, Suction available and Patient being monitored Patient Re-evaluated:Patient Re-evaluated prior to induction Oxygen Delivery Method: Circle system utilized Preoxygenation: Pre-oxygenation with 100% oxygen Induction Type: IV induction Ventilation: Mask ventilation without difficulty Laryngoscope Size: Mac and 3 Grade View: Grade I Tube type: Oral Tube size: 7.0 mm Number of attempts: 1 Airway Equipment and Method: Stylet and Oral airway Placement Confirmation: ETT inserted through vocal cords under direct vision, positive ETCO2 and breath sounds checked- equal and bilateral Secured at: 21 cm Tube secured with: Tape Dental Injury: Teeth and Oropharynx as per pre-operative assessment

## 2022-02-09 NOTE — Op Note (Signed)
   Date of Surgery: 02/09/2022  INDICATIONS: The patient is a 66 year old female with left shoulder pain that has failed conservative treatment;  The patient did consent to the procedure after discussion of the risks and benefits.  DIAGNOSES: Left shoulder, SLAP tear, biceps tendinitis, subacromial impingement, and rotator cuff tendinosis .  POST-OPERATIVE DIAGNOSIS: same  PROCEDURE: Arthroscopic extensive debridement - 29823 Subdeltoid Bursa, Supraspinatus Tendon, Anterior Labrum, Superior Labrum, Posterior Labrum, and biceps tenotomy Arthroscopic subacromial decompression - 62130   OPERATIVE FINDING: Exam under anesthesia: Normal Articular space:  Grade 1 changes Chondral surfaces:  Grade 1 changes Biceps:  Moderate tendinopathy Subscapularis: Intact  Supraspinatus: Intact bursal sided delaminated partial-thickness tear Infraspinatus: Intact   SURGEON: N. Glee Arvin, M.D.  ASSIST: Oneal Grout, PA-C  ANESTHESIA:  general, regional  IV FLUIDS AND URINE: See anesthesia.  ESTIMATED BLOOD LOSS: minimal mL.  IMPLANTS: None  COMPLICATIONS: None.  DESCRIPTION OF PROCEDURE: The patient was brought to the operating room and placed supine on the operating table.  The patient had been signed prior to the procedure and this was documented. The patient had the anesthesia placed by the anesthesiologist.  A time-out was performed to confirm that this was the correct patient, site, side and location. The patient did receive antibiotics prior to the incision and was re-dosed during the procedure as needed at indicated intervals.  The patient was then positioned into the beach chair position with all bony prominences well padded and neutral C spine. The patient had the operative extremity prepped and draped in the standard surgical fashion.    Incisions were made for standard shoulder arthroscopy portals.  Diagnostic shoulder arthroscopy ensued.  There was mild synovitis in the rotator  interval.  There was significant degenerative tearing of the anterior superior and posterior labrum as well as moderate proximal biceps tendinopathy.  Biceps tenotomy was performed.  The superior labral stump was debrided back to stable margins.  Grade 1 changes were noted on the glenohumeral surfaces.  The articular surface of the superior cuff had mild tendinopathy.  This was gently debrided.  The subscapularis tendon was unremarkable.  The arthroscope was then repositioned into the subacromial space.  There was thick subdeltoid and subacromial bursitis.  This was debrided with a shaver.  Acromioplasty and subacromial decompression and CA ligament release performed.  There was partial-thickness delaminated bursal sided tear of the superior rotator cuff which was gently debrided back to stable margins.  No full-thickness tears were encountered.  The distal clavicle and AC joint were unremarkable.  Excess fluid was expressed from the shoulder and incisions closed with nylon.  Sterile dressings were applied.  Shoulder sling was placed.  Patient tolerated procedure well had no many complications. Tessa Lerner, my PA, was a medical necessity for the entirety of the surgery including opening, closing, limb positioning, retracting, exposing, and repairing.  POSTOPERATIVE PLAN: Patient will be discharged home and follow-up in a week for suture removal and initiation of physical therapy.  Mayra Reel, MD [facility] 8:30 AM

## 2022-02-09 NOTE — Anesthesia Postprocedure Evaluation (Signed)
Anesthesia Post Note  Patient: Gabriella Norman  Procedure(s) Performed: LEFT SHOULDER ARTHROSCOPY, EXTENSIVE DEBRIDEMENT, SUBACROMIAL DECOMPRESSION (Left: Shoulder)     Patient location during evaluation: PACU Anesthesia Type: General Level of consciousness: awake and alert Pain management: pain level controlled Vital Signs Assessment: post-procedure vital signs reviewed and stable Respiratory status: spontaneous breathing, nonlabored ventilation, respiratory function stable and patient connected to nasal cannula oxygen Cardiovascular status: blood pressure returned to baseline and stable Postop Assessment: no apparent nausea or vomiting Anesthetic complications: no   No notable events documented.  Last Vitals:  Vitals:   02/09/22 0910 02/09/22 0919  BP: (!) 175/84 (!) 142/84  Pulse: 82 80  Resp: 18   Temp:  36.4 C  SpO2: 95% 95%    Last Pain:  Vitals:   02/09/22 0919  TempSrc: Oral  PainSc: 0-No pain                 Clearence Vitug S

## 2022-02-09 NOTE — Anesthesia Procedure Notes (Signed)
Anesthesia Regional Block: Interscalene brachial plexus block   Pre-Anesthetic Checklist: , timeout performed,  Correct Patient, Correct Site, Correct Laterality,  Correct Procedure, Correct Position, site marked,  Risks and benefits discussed,  Surgical consent,  Pre-op evaluation,  At surgeon's request and post-op pain management  Laterality: Left  Prep: chloraprep       Needles:  Injection technique: Single-shot  Needle Type: Echogenic Stimulator Needle     Needle Length: 9cm      Additional Needles:   Procedures:,,,, ultrasound used (permanent image in chart),,    Narrative:  Start time: 02/09/2022 6:50 AM End time: 02/09/2022 7:00 AM Injection made incrementally with aspirations every 5 mL.  Performed by: Personally  Anesthesiologist: Eilene Ghazi, MD  Additional Notes: Patient tolerated the procedure well without complications

## 2022-02-09 NOTE — Anesthesia Procedure Notes (Signed)
Anesthesia Procedure Image    

## 2022-02-09 NOTE — Anesthesia Preprocedure Evaluation (Signed)
Anesthesia Evaluation  Patient identified by MRN, date of birth, ID band Patient awake    Reviewed: Allergy & Precautions, NPO status , Patient's Chart, lab work & pertinent test results  Airway Mallampati: II  TM Distance: <3 FB Neck ROM: Full    Dental no notable dental hx.    Pulmonary asthma ,    Pulmonary exam normal breath sounds clear to auscultation       Cardiovascular hypertension, Pt. on medications Normal cardiovascular exam Rhythm:Regular Rate:Normal     Neuro/Psych negative neurological ROS  negative psych ROS   GI/Hepatic negative GI ROS, Neg liver ROS,   Endo/Other  diabetes, Type 2  Renal/GU negative Renal ROS  negative genitourinary   Musculoskeletal negative musculoskeletal ROS (+)   Abdominal   Peds negative pediatric ROS (+)  Hematology negative hematology ROS (+)   Anesthesia Other Findings   Reproductive/Obstetrics negative OB ROS                             Anesthesia Physical Anesthesia Plan  ASA: 2  Anesthesia Plan: General   Post-op Pain Management: Regional block*   Induction: Intravenous  PONV Risk Score and Plan: 3 and Ondansetron, Dexamethasone, Midazolam and Treatment may vary due to age or medical condition  Airway Management Planned: Oral ETT  Additional Equipment:   Intra-op Plan:   Post-operative Plan: Extubation in OR  Informed Consent: I have reviewed the patients History and Physical, chart, labs and discussed the procedure including the risks, benefits and alternatives for the proposed anesthesia with the patient or authorized representative who has indicated his/her understanding and acceptance.     Dental advisory given  Plan Discussed with: CRNA and Surgeon  Anesthesia Plan Comments:         Anesthesia Quick Evaluation

## 2022-02-09 NOTE — Transfer of Care (Signed)
Immediate Anesthesia Transfer of Care Note  Patient: Gabriella Norman  Procedure(s) Performed: LEFT SHOULDER ARTHROSCOPY, EXTENSIVE DEBRIDEMENT, SUBACROMIAL DECOMPRESSION (Left: Shoulder)  Patient Location: PACU  Anesthesia Type:GA combined with regional for post-op pain  Level of Consciousness: sedated  Airway & Oxygen Therapy: Patient Spontanous Breathing and Patient connected to face mask oxygen  Post-op Assessment: Report given to RN and Post -op Vital signs reviewed and stable  Post vital signs: Reviewed and stable  Last Vitals:  Vitals Value Taken Time  BP 188/108 02/09/22 0846  Temp    Pulse 83 02/09/22 0849  Resp 18 02/09/22 0849  SpO2 99 % 02/09/22 0849  Vitals shown include unvalidated device data.  Last Pain:  Vitals:   02/09/22 0635  TempSrc: Oral  PainSc: 0-No pain      Patients Stated Pain Goal: 0 ([id number] [phone])  Complications: No notable events documented.

## 2022-02-09 NOTE — Progress Notes (Signed)
Assisted Dr. Rose with left, interscalene , ultrasound guided block. Side rails up, monitors on throughout procedure. See vital signs in flow sheet. Tolerated Procedure well. 

## 2022-02-09 NOTE — H&P (Signed)
PREOPERATIVE H&P  Chief Complaint: left shoulder rotator cuff arthropathy, slap tear, impingement  HPI: Gabriella Norman is a 66 y.o. female who presents for surgical treatment of left shoulder rotator cuff arthropathy, slap tear, impingement.  She denies any changes in medical history.  Past Medical History:  Diagnosis Date   Asthma    DM (diabetes mellitus) (HCC)    Hyperlipidemia    Hypertension    Past Surgical History:  Procedure Laterality Date   TUBAL LIGATION     Social History   Socioeconomic History   Marital status: Single    Spouse name: Not on file   Number of children: Not on file   Years of education: Not on file   Highest education level: Not on file  Occupational History   Not on file  Tobacco Use   Smoking status: Never   Smokeless tobacco: Never  Vaping Use   Vaping Use: Never used  Substance and Sexual Activity   Alcohol use: Never   Drug use: Never   Sexual activity: Not on file  Other Topics Concern   Not on file  Social History Narrative   Right handed    Lives with family    Social Determinants of Health   Financial Resource Strain: Not on file  Food Insecurity: Not on file  Transportation Needs: Not on file  Physical Activity: Not on file  Stress: Not on file  Social Connections: Not on file   History reviewed. No pertinent family history. No Known Allergies Prior to Admission medications   Medication Sig Start Date End Date Taking? Authorizing Provider  losartan-hydrochlorothiazide (HYZAAR) 50-12.5 MG tablet 1 (one) time each day at the same time.   Yes [provider]  metFORMIN (GLUCOPHAGE) 500 MG tablet TAKE 1 TABLET BY MOUTH TWICE DAILY FOR 90 DAYS 07/17/18  Yes [provider]  rosuvastatin (CRESTOR) 20 MG tablet Take 20 mg by mouth daily.   Yes [provider]  benzonatate (TESSALON) 200 MG capsule Take 1 capsule by mouth three times daily as needed for cough Patient not taking: Reported on  01/09/2022 01/30/19   Nyoka Cowden, MD  famotidine (PEPCID) 20 MG tablet One at bedtime 09/23/18   Nyoka Cowden, MD  pantoprazole (PROTONIX) 40 MG tablet TAKE 1 TABLET BY MOUTH ONCE DAILY(TAKE 30-60 MINUTES BEFORE FIRST MEAL OF THE DAY) 01/27/19   Nyoka Cowden, MD     Positive ROS: All other systems have been reviewed and were otherwise negative with the exception of those mentioned in the HPI and as above.  Physical Exam: General: Alert, no acute distress Cardiovascular: No pedal edema Respiratory: No cyanosis, no use of accessory musculature GI: abdomen soft Skin: No lesions in the area of chief complaint Neurologic: Sensation intact distally Psychiatric: Patient is competent for consent with normal mood and affect Lymphatic: no lymphedema  MUSCULOSKELETAL: exam stable  Assessment: left shoulder rotator cuff arthropathy, slap tear, impingement  Plan: Plan for Procedure(s): LEFT SHOULDER ARTHROSCOPY, EXTENSIVE DEBRIDEMENT, SUBACROMIAL DECOMPRESSION, POSSIBLE ROTATOR CUFF REPAIR  The risks benefits and alternatives were discussed with the patient including but not limited to the risks of nonoperative treatment, versus surgical intervention including infection, bleeding, nerve injury,  blood clots, cardiopulmonary complications, morbidity, mortality, among others, and they were willing to proceed.   Preoperative templating of the joint replacement has been completed, documented, and submitted to the Operating Room personnel in order to optimize intra-operative equipment management.   Glee Arvin, MD 02/09/2022 6:15 AM

## 2022-02-13 ENCOUNTER — Encounter (HOSPITAL_BASED_OUTPATIENT_CLINIC_OR_DEPARTMENT_OTHER): Payer: Self-pay | Admitting: Orthopaedic Surgery

## 2022-02-13 NOTE — Addendum Note (Signed)
Addendum  created 02/13/22 1233 by Burna Cash, CRNA   Charge Capture section accepted

## 2022-02-16 ENCOUNTER — Encounter: Payer: Self-pay | Admitting: Orthopaedic Surgery

## 2022-02-16 ENCOUNTER — Ambulatory Visit (INDEPENDENT_AMBULATORY_CARE_PROVIDER_SITE_OTHER): Payer: 59 | Admitting: Physician Assistant

## 2022-02-16 DIAGNOSIS — Z9889 Other specified postprocedural states: Secondary | ICD-10-CM

## 2022-02-16 MED ORDER — HYDROCODONE-ACETAMINOPHEN 5-325 MG PO TABS
1.0000 | ORAL_TABLET | Freq: Three times a day (TID) | ORAL | 0 refills | Status: AC | PRN
Start: 2022-02-16 — End: ?

## 2022-02-16 NOTE — Progress Notes (Signed)
   Post-Op Visit Note   Patient: Gabriella Norman           Date of Birth: [birth year]           MRN: [id number] Visit Date: 02/16/2022 PCP: Collene Mares, PA   Assessment & Plan:  Chief Complaint:  Chief Complaint  Patient presents with   Left Shoulder - Follow-up    Left shoulder arthroscopy 02/09/2022   Visit Diagnoses:  1. S/P arthroscopy of left shoulder     Plan: Patient is a pleasant 66 year old female who comes in today 1 week status post left shoulder arthroscopic debridement, biceps tenotomy and subacromial decompression, date of surgery 02/09/2022.  She has been doing well.  She has been in a moderate amount of pain but has been taking oxycodone.  Examination of the left shoulder reveals fully healed surgical portals with nylon sutures in place.  No evidence of infection or cellulitis.  Fingers are warm well from previous views.  He is neurovascular intact distally.  Today, sutures were removed and Steri-Strips applied.  Intraoperative pictures reviewed.  Outpatient physical therapy referral has been made.  Follow-up in 5 weeks time for recheck.  Call with concerns or questions.  Follow-Up Instructions: Return in about 5 weeks (around 03/23/2022).   Orders:  No orders of the defined types were placed in this encounter.  No orders of the defined types were placed in this encounter.   Imaging: No new imaging  PMFS History: Patient Active Problem List   Diagnosis Date Noted   Superior labrum anterior-to-posterior (SLAP) tear of right shoulder 09/29/2021   Mild cognitive impairment 07/06/2021   Memory loss 04/03/2021   Pure hypercholesterolemia 04/23/2019   Type 2 diabetes mellitus without complication, without long-term current use of insulin (HCC) 04/23/2019   Benign essential HTN 04/17/2019   Cough variant asthma vs UACS 09/23/2018   Past Medical History:  Diagnosis Date   Asthma    DM (diabetes mellitus) (HCC)    Hyperlipidemia    Hypertension     No family  history on file.  Past Surgical History:  Procedure Laterality Date   SHOULDER ARTHROSCOPY WITH ROTATOR CUFF REPAIR AND SUBACROMIAL DECOMPRESSION Left 02/09/2022   Procedure: LEFT SHOULDER ARTHROSCOPY, EXTENSIVE DEBRIDEMENT, SUBACROMIAL DECOMPRESSION;  Surgeon: Tarry Kos, MD;  Location: [facility];  Service: Orthopedics;  Laterality: Left;   TUBAL LIGATION     Social History   Occupational History   Not on file  Tobacco Use   Smoking status: Never   Smokeless tobacco: Never  Vaping Use   Vaping Use: Never used  Substance and Sexual Activity   Alcohol use: Never   Drug use: Never   Sexual activity: Not on file

## 2022-02-20 NOTE — Therapy (Incomplete)
OUTPATIENT PHYSICAL THERAPY SHOULDER EVALUATION   Patient Name: Gabriella Norman MRN: [id number] DOB:[birth year], 66 y.o., female Today's Date: 02/20/2022    Past Medical History:  Diagnosis Date   Asthma    DM (diabetes mellitus) (HCC)    Hyperlipidemia    Hypertension    Past Surgical History:  Procedure Laterality Date   SHOULDER ARTHROSCOPY WITH ROTATOR CUFF REPAIR AND SUBACROMIAL DECOMPRESSION Left 02/09/2022   Procedure: LEFT SHOULDER ARTHROSCOPY, EXTENSIVE DEBRIDEMENT, SUBACROMIAL DECOMPRESSION;  Surgeon: Tarry Kos, MD;  Location: [facility];  Service: Orthopedics;  Laterality: Left;   TUBAL LIGATION     Patient Active Problem List   Diagnosis Date Noted   Superior labrum anterior-to-posterior (SLAP) tear of right shoulder 09/29/2021   Mild cognitive impairment 07/06/2021   Memory loss 04/03/2021   Pure hypercholesterolemia 04/23/2019   Type 2 diabetes mellitus without complication, without long-term current use of insulin (HCC) 04/23/2019   Benign essential HTN 04/17/2019   Cough variant asthma vs UACS 09/23/2018    PCP: Collene Mares, [place]  REFERRING PROVIDER: Cristie Hem, PA-C  REFERRING DIAG:Z98.890 (ICD-10-CM) - S/P arthroscopy of left shoulder   DIAGNOSES: Left shoulder, SLAP tear, biceps tendinitis, subacromial impingement, and rotator cuff tendinosis .   POST-OPERATIVE DIAGNOSIS: same   PROCEDURE: Arthroscopic extensive debridement - 29823 Subdeltoid Bursa, Supraspinatus Tendon, Anterior Labrum, Superior Labrum, Posterior Labrum, and biceps tenotomy Arthroscopic subacromial decompression - 29518  THERAPY DIAG:  No diagnosis found.  Rationale for Evaluation and Treatment  Rehabilitation      ONSET DATE: ***  SUBJECTIVE:                                                                           Rehabilitation                                                                                                           SUBJECTIVE  STATEMENT: ***  PERTINENT HISTORY: ***  PAIN:  Are you having pain? {OPRCPAIN:27236}  PRECAUTIONS: {Therapy precautions:24002}  WEIGHT BEARING RESTRICTIONS {Yes ***/No:24003}  FALLS:  Has patient fallen in last 6 months? {fallsyesno:27318}  LIVING ENVIRONMENT: Lives with: {OPRC lives with:25569::"lives with their family"} Lives in: {Lives in:25570} Stairs: {opstairs:27293} Has following equipment at home: {Assistive devices:23999}  OCCUPATION: ***  PLOF: {PLOF:24004}  PATIENT GOALS ***  OBJECTIVE:   DIAGNOSTIC FINDINGS:  ***  PATIENT SURVEYS:  {rehab surveys:24030:a}  COGNITION:  Overall cognitive status: {cognition:24006}     SENSATION: {sensation:27233}  POSTURE: ***  UPPER EXTREMITY ROM:   {AROM/PROM:27142} ROM Right eval Left eval  Shoulder flexion    Shoulder extension    Shoulder abduction    Shoulder adduction    Shoulder internal rotation    Shoulder external rotation    Elbow flexion  Elbow extension    Wrist flexion    Wrist extension    Wrist ulnar deviation    Wrist radial deviation    Wrist pronation    Wrist supination    (Blank rows = not tested)  UPPER EXTREMITY MMT:  MMT Right eval Left eval  Shoulder flexion    Shoulder extension    Shoulder abduction    Shoulder adduction    Shoulder internal rotation    Shoulder external rotation    Middle trapezius    Lower trapezius    Elbow flexion    Elbow extension    Wrist flexion    Wrist extension    Wrist ulnar deviation    Wrist radial deviation    Wrist pronation    Wrist supination    Grip strength (lbs)    (Blank rows = not tested)  SHOULDER SPECIAL TESTS:  Impingement tests: {shoulder impingement test:25231:a}  SLAP lesions: {SLAP lesions:25232}  Instability tests: {shoulder instability test:25233}  Rotator cuff assessment: {rotator cuff assessment:25234}  Biceps assessment: {biceps assessment:25235}  JOINT MOBILITY TESTING:  ***  PALPATION:   ***   TODAY'S TREATMENT:  ***   PATIENT EDUCATION: Education details: *** Person educated: {Person educated:25204} Education method: {Education Method:25205} Education comprehension: {Education Comprehension:25206}   HOME EXERCISE PROGRAM: ***  ASSESSMENT:  CLINICAL IMPRESSION: Patient is a *** y.o. *** who was seen today for physical therapy evaluation and treatment for ***.    OBJECTIVE IMPAIRMENTS {opptimpairments:25111}.   ACTIVITY LIMITATIONS {activitylimitations:27494}  PARTICIPATION LIMITATIONS: {participationrestrictions:25113}  PERSONAL FACTORS {Personal factors:25162} are also affecting patient's functional outcome.   REHAB POTENTIAL: {rehabpotential:25112}  CLINICAL DECISION MAKING: {clinical decision making:25114}  EVALUATION COMPLEXITY: {Evaluation complexity:25115}   GOALS: Goals reviewed with patient? {yes/no:20286}  SHORT TERM GOALS: Target date: {follow up:25551}  (Remove Blue Hyperlink)  *** Baseline: Goal status: {GOALSTATUS:25110}  2.  *** Baseline:  Goal status: {GOALSTATUS:25110}  3.  *** Baseline:  Goal status: {GOALSTATUS:25110}  4.  *** Baseline:  Goal status: {GOALSTATUS:25110}  5.  *** Baseline:  Goal status: {GOALSTATUS:25110}  6.  *** Baseline:  Goal status: {GOALSTATUS:25110}  LONG TERM GOALS: Target date: {follow up:25551}  (Remove Blue Hyperlink)  *** Baseline:  Goal status: {GOALSTATUS:25110}  2.  *** Baseline:  Goal status: {GOALSTATUS:25110}  3.  *** Baseline:  Goal status: {GOALSTATUS:25110}  4.  *** Baseline:  Goal status: {GOALSTATUS:25110}  5.  *** Baseline:  Goal status: {GOALSTATUS:25110}  6.  *** Baseline:  Goal status: {GOALSTATUS:25110}   PLAN: PT FREQUENCY: {rehab frequency:25116}  PT DURATION: {rehab duration:25117}  PLANNED INTERVENTIONS: {rehab planned interventions:25118::"Therapeutic exercises","Therapeutic activity","Neuromuscular re-education","Balance  training","Gait training","Patient/Family education","Joint mobilization"}  PLAN FOR NEXT SESSION: ***   Verne Cove, PT 02/20/2022, 9:52 PM

## 2022-02-21 ENCOUNTER — Ambulatory Visit: Payer: 59 | Attending: Physician Assistant

## 2022-02-21 DIAGNOSIS — M25512 Pain in left shoulder: Secondary | ICD-10-CM | POA: Insufficient documentation

## 2022-02-21 DIAGNOSIS — M6281 Muscle weakness (generalized): Secondary | ICD-10-CM | POA: Insufficient documentation

## 2022-02-26 NOTE — Therapy (Signed)
OUTPATIENT PHYSICAL THERAPY SHOULDER EVALUATION   Patient Name: Gabriella Norman MRN: [id number] DOB:15-Sep-1956, 66 y.o., female Today's Date: 02/27/2022   PT End of Session - 02/27/22 1005     Visit Number 1    Number of Visits 16    Date for PT Re-Evaluation 04/24/22    Authorization Type [employer]    PT Start Time [phone]    PT Stop Time 1015    PT Time Calculation (min) 42 min    Activity Tolerance Patient tolerated treatment well;Patient limited by pain    Behavior During Therapy [facility] for tasks assessed/performed             Past Medical History:  Diagnosis Date   Asthma    DM (diabetes mellitus) (HCC)    Hyperlipidemia    Hypertension    Past Surgical History:  Procedure Laterality Date   SHOULDER ARTHROSCOPY WITH ROTATOR CUFF REPAIR AND SUBACROMIAL DECOMPRESSION Left 02/09/2022   Procedure: LEFT SHOULDER ARTHROSCOPY, EXTENSIVE DEBRIDEMENT, SUBACROMIAL DECOMPRESSION;  Surgeon: Tarry Kos, MD;  [facility];  Service: Orthopedics;  Laterality: Left;   TUBAL LIGATION     Patient Active Problem List   Diagnosis Date Noted   Superior labrum anterior-to-posterior (SLAP) tear of right shoulder 09/29/2021   Mild cognitive impairment 07/06/2021   Memory loss 04/03/2021   Pure hypercholesterolemia 04/23/2019   Type 2 diabetes mellitus without complication, without long-term current use of insulin (HCC) 04/23/2019   Benign essential HTN 04/17/2019   Cough variant asthma vs UACS 09/23/2018    PCP: Collene Mares, [place]  REFERRING PROVIDER: Cristie Hem, PA-C  REFERRING DIAG: [phone] (ICD-10-CM) - S/P arthroscopy of left shoulder  THERAPY DIAG:  Acute pain of left shoulder - Plan: PT plan of care cert/re-cert  Muscle weakness (generalized) - Plan: PT plan of care cert/re-cert  Rationale for Evaluation and Treatment Rehabilitation  ONSET DATE: 02/09/2022 Left Shld Arthroscopy -post left shoulder arthroscopic debridement, biceps  tenotomy and subacromial decompression  SUBJECTIVE:                                                                                                                                                                                      SUBJECTIVE STATEMENT: Pt underwent surgery on 02-08-22 with Dr Roda Shutters . I have been back to the MD one time to remove stitches.  He said to not lift anything with that arm. Pt came in without sling but PA said she did not need any sling. So enters clinic with arm hanging down.  Pt is waking up 3 x a night due to pain in left shld.  Unable to sleep on  that side. Pt would like to be able to carry 43 lb 21 month old grandson. And return to cooking and cleaning in her home  PERTINENT HISTORY: Asthma, DM, Hyperlipidemia HTN. Mild cognitive impairment  Presently LEFT SHOULDER ARTHROSCOPY, EXTENSIVE DEBRIDEMENT, SUBACROMIAL DECOMPRESSION;  Superior labrum anterior-to-posterior (SLAP) tear of right shoulder  PAIN:  Are you having pain? Yes: NPRS scale: 6/10 Pain location: Left shoulder Pain description: sharp pain Aggravating factors: moving it.cant sleep on my arm, cannot pick up any items with arm Relieving factors: medication, utilizing pillow  PRECAUTIONS: Shoulder No lifting, no resistance over biceps tenotomy PROM initially  WEIGHT BEARING RESTRICTIONS No  FALLS:  Has patient fallen in last 6 months? 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 There is no evidence of arthropathy or other focal  bone abnormality. Soft tissues are unremarkable.   IMPRESSION: Negative.  PATIENT SURVEYS:  FOTO 32% eval  57% predicted  COGNITION:  Overall cognitive status: Within functional limits for tasks assessed  mild cognitive impairment in history  with daughter for eval     SENSATION: WFL Light touch: Impaired  pt with some numbness into Left forearm   POSTURE: Forward head and rounded shoulders left more forward than Right  UPPER EXTREMITY ROM:   Active/ Passive ROM Right eval Left eval  Shoulder flexion 160/P 165 50/P-90  Shoulder extension 35   Shoulder abduction 157 50- 80  Shoulder adduction    Shoulder internal rotation 90 P30  Shoulder external rotation 60 P 0  Elbow flexion    Elbow extension    Wrist flexion    Wrist extension    Wrist ulnar deviation    Wrist radial deviation    Wrist pronation    Wrist supination    (Blank rows = not tested)  UPPER EXTREMITY MMT:       Not tested due to post surgery  MMT Right eval Left eval  Shoulder flexion    Shoulder extension    Shoulder abduction    Shoulder adduction    Shoulder internal rotation    Shoulder external rotation    Middle trapezius    Lower trapezius    Elbow flexion    Elbow extension    Wrist flexion    Wrist extension    Wrist ulnar deviation    Wrist radial deviation    Wrist pronation    Wrist supination    Grip strength (lbs)    (Blank rows = not tested)  SHOULDER SPECIAL TESTS:  NT due to recent shld arthroscopy biceps tenotomy and SAD  JOINT MOBILITY TESTING:  NT due to recent surgery  PALPATION:  TTP over incision and over Left biceps muscle belly   TODAY'S TREATMENT:  02-27-22 Eval and manual PROM/ issue pendulum exercises   PATIENT EDUCATION: Education details: POC Explanation of findings issue HEP,  Person educated: Patient and Child(ren) Education method: Explanation, Demonstration, Tactile cues, Verbal cues, and Handouts Education comprehension: verbalized  understanding, returned demonstration, verbal cues required, tactile cues required, and needs  further education   HOME EXERCISE PROGRAM: Access Code: [email or web address] URL: [email or web address] Date: 02/27/2022 Prepared by: Garen LahLawrie Padraic Marinos  Exercises - Circular Shoulder Pendulum with Table Support  - 3 x daily - 7 x weekly - 2 sets - 10 reps - Flexion-Extension Shoulder Pendulum with Table Support  - 3 x daily - 7 x weekly - 2 sets - 10 reps - Horizontal Shoulder Pendulum with Table Support  - 3 x daily - 7 x weekly - 2 sets - 10 reps  Patient Education - Ice - Heat  ASSESSMENT:  CLINICAL IMPRESSION: Patient is a 66  y.o. female who was seen today for physical therapy evaluation and treatment for -post left shoulder arthroscopic debridement, biceps tenotomy and subacromial decompression. Pt unable to utilize or carry any objects presently post surgery. Pt unable to sleep on Left side and has 6/10 pain.  Pt will benefit from skilled PT to address impairment and rehab in order to return to functional use of arm in order to return to her routine of praying in prayer position and carrying her grandson   OBJECTIVE IMPAIRMENTS decreased ROM, decreased strength, impaired UE functional use, postural dysfunction, and pain.   ACTIVITY LIMITATIONS carrying, lifting, sleeping, bathing, toileting, dressing, reach over head, and caring for others not being able to lift 30 # grandson  PARTICIPATION LIMITATIONS:  cannot lift grandson and do housework  PERSONAL FACTORS Asthma, DM, Hyperlipidemia HTN. Mild cognitive impairment  are also affecting patient's functional outcome.   REHAB POTENTIAL: Good  CLINICAL DECISION MAKING: Evolving/moderate complexity  EVALUATION COMPLEXITY: Moderate   GOALS: Goals reviewed with patient? Yes  SHORT TERM GOALS: Target date: 03/27/2022  (Remove Blue Hyperlink)  Pt will be independent with initial HEP Baseline:noknowledge Goal status: INITIAL  2.   Pt will gain PROM in Left shld flexion to at least 140 degrees Baseline: eval 90 degrees PROM Goal status: INITIAL  3.  Pt will be able to sleep throughout night for at least 4 hours without waking utilizing pillows and positioning for  left shld comform Baseline: wakes at least 3 times a night and cannot sleep on left side Goal status: INITIAL    LONG TERM GOALS: Target date: 04/24/2022  (Remove Blue Hyperlink)  Pt will be independent with advanced HEP Baseline: No knowledge Goal status: INITIAL  2.  Pt will be able to hold grandson who is 30 # and carry without exacerbating pain in Left shoulder Baseline: Unable to lift any object or have any resistance at eval Goal status: INITIAL  3.  Pt will be able to sleep on Left side without waking during the night for at least 4-5 hours of uninterrupted sleep. Baseline: Pt wakes at least 3 time a night due to pain in left shld /unable to lie of left shld Goal status: INITIAL  4.  Pt will be able to perform household chores like sweeping the floor and cooking  with pain 2/10 or less Baseline: unable to perform any household chores Goal status: INITIAL  5.  Pt will be able to perform prayers in childs pose without exacerbating pain in arms Baseline: unable to pray in Muslim prayer posture due to arm pain and decreased AROM Goal status: INITIAL  6.  FOTO will improve from 32%   to  57%     indicating improved functional mobility . Baseline: eval 32% Goal status: INITIAL  7. Pt will be able to lift dishes above head to put in cabinet in order to complete kitchen work.  Baseline:  unable to lift left UE  GOAL status INITIAL   PLAN: PT FREQUENCY: 2x/week  PT DURATION: 8 weeks  PLANNED INTERVENTIONS: Therapeutic exercises, Therapeutic activity, Neuromuscular re-education, Patient/Family education, Joint mobilization, Dry Needling, Electrical stimulation, Cryotherapy, Moist heat, Taping, Ionotophoresis 4mg /ml Dexamethasone, Manual  therapy, and Re-evaluation  PLAN FOR NEXT SESSION: PROM and review pendulum exercises. ? Isometric as pt tolerates   , PT, [facility] Certified Exercise Expert for the Aging Adult  02/27/22 2:51 PM Phone: [phone] Fax: [phone]

## 2022-02-27 ENCOUNTER — Ambulatory Visit: Payer: 59 | Admitting: Physical Therapy

## 2022-02-27 DIAGNOSIS — M6281 Muscle weakness (generalized): Secondary | ICD-10-CM | POA: Diagnosis present

## 2022-02-27 DIAGNOSIS — M25512 Pain in left shoulder: Secondary | ICD-10-CM

## 2022-02-27 NOTE — Patient Instructions (Signed)
Access Code: [email or web address] URL: [email or web address] Date: 02/27/2022 Prepared by: Garen Lah  Exercises - Circular Shoulder Pendulum with Table Support  - 3 x daily - 7 x weekly - 2 sets - 10 reps - Flexion-Extension Shoulder Pendulum with Table Support  - 3 x daily - 7 x weekly - 2 sets - 10 reps - Horizontal Shoulder Pendulum with Table Support  - 3 x daily - 7 x weekly - 2 sets - 10 reps  Patient Education - Ice - Heat   Cryotherapy Cryotherapy means treatment with cold. Ice or gel packs can be used to reduce both pain and swelling. Ice is the most helpful within the first 24 to 48 hours after an injury or flare-up from overusing a muscle or joint. Sprains, strains, spasms, burning pain, shooting pain, and aches can all be eased with ice. Ice can also be used when recovering from surgery. Ice is effective, has very few side effects, and is safe for most people to use. PRECAUTIONS  Ice is not a safe treatment option for people with: Raynaud phenomenon. This is a condition affecting small blood vessels in the extremities. Exposure to cold may cause your problems to return. Cold hypersensitivity. There are many forms of cold hypersensitivity, including: Cold urticaria. Red, itchy hives appear on the skin when the tissues begin to warm after being iced. Cold erythema. This is a red, itchy rash caused by exposure to cold. Cold hemoglobinuria. Red blood cells break down when the tissues begin to warm after being iced. The hemoglobin that carry oxygen are passed into the urine because they cannot combine with blood proteins fast enough. Numbness or altered sensitivity in the area being iced. If you have any of the following conditions, do not use ice until you have discussed cryotherapy with your caregiver: Heart conditions, such as arrhythmia, angina, or chronic heart disease. High blood pressure. Healing wounds or open skin in the area being iced. Current  infections. Rheumatoid arthritis. Poor circulation. Diabetes. Ice slows the blood flow in the region it is applied. This is beneficial when trying to stop inflamed tissues from spreading irritating chemicals to surrounding tissues. However, if you expose your skin to cold temperatures for too long or without the proper protection, you can damage your skin or nerves. Watch for signs of skin damage due to cold. HOME CARE INSTRUCTIONS Follow these tips to use ice and cold packs safely. Place a dry or damp towel between the ice and skin. A damp towel will cool the skin more quickly, so you may need to shorten the time that the ice is used. For a more rapid response, add gentle compression to the ice. Ice for no more than 10 to 20 minutes at a time. The bonier the area you are icing, the less time it will take to get the benefits of ice. Check your skin after 5 minutes to make sure there are no signs of a poor response to cold or skin damage. Rest 20 minutes or more between uses. Once your skin is numb, you can end your treatment. You can test numbness by very lightly touching your skin. The touch should be so light that you do not see the skin dimple from the pressure of your fingertip. When using ice, most people will feel these normal sensations in this order: cold, burning, aching, and numbness. Do not use ice on someone who cannot communicate their responses to pain, such as small children or people with dementia. HOW TO MAKE AN  ICE PACK Ice packs are the most common way to use ice therapy. Other methods include ice massage, ice baths, and cryosprays. Muscle creams that cause a cold, tingly feeling do not offer the same benefits that ice offers and should not be used as a substitute unless recommended by your caregiver. To make an ice pack, do one of the following: Place crushed ice or a bag of frozen vegetables in a sealable plastic bag. Squeeze out the excess air. Place this bag inside another  plastic bag. Slide the bag into a pillowcase or place a damp towel between your skin and the bag. Mix 3 parts water with 1 part rubbing alcohol. Freeze the mixture in a sealable plastic bag. When you remove the mixture from the freezer, it will be slushy. Squeeze out the excess air. Place this bag inside another plastic bag. Slide the bag into a pillowcase or place a damp towel between your skin and the bag. SEEK MEDICAL CARE IF: You develop white spots on your skin. This may give the skin a blotchy (mottled) appearance. Your skin turns blue or pale. Your skin becomes waxy or hard. Your swelling gets worse. MAKE SURE YOU:  Understand these instructions. Will watch your condition. Will get help right away if you are not doing well or get worse. Document Released: 04/30/2011 Document Revised: 01/18/2014 Document Reviewed: 04/30/2011 [facility] Patient Information 2015 [place], [place]. This information is not intended to replace advice given to you by your health care provider. Make sure you discuss any questions you have with your health care provider.   Garen Lah, PT, ATRIC Certified Exercise Expert for the Aging Adult  02/27/22 10:04 AM Phone: [phone] Fax: [phone]

## 2022-03-01 ENCOUNTER — Ambulatory Visit: Payer: 59 | Admitting: Physical Therapy

## 2022-03-01 ENCOUNTER — Encounter: Payer: Self-pay | Admitting: Physical Therapy

## 2022-03-01 DIAGNOSIS — M6281 Muscle weakness (generalized): Secondary | ICD-10-CM

## 2022-03-01 DIAGNOSIS — M25512 Pain in left shoulder: Secondary | ICD-10-CM

## 2022-03-01 NOTE — Therapy (Signed)
OUTPATIENT PHYSICAL THERAPY TREATMENT NOTE   Patient Name: Gabriella Norman MRN: [id number] DOB:June 30, 1956, 66 y.o., female Today's Date: 03/01/2022 PCP: Collene Mares, [place]   REFERRING PROVIDER: Cristie Hem, PA-C  END OF SESSION:   PT End of Session - 03/01/22 1234     Visit Number 2    Number of Visits 16    Date for PT Re-Evaluation 04/24/22    Authorization Type [employer]    PT Start Time 1232    PT Stop Time 1300    PT Time Calculation (min) 28 min             Past Medical History:  Diagnosis Date   Asthma    DM (diabetes mellitus) (HCC)    Hyperlipidemia    Hypertension    Past Surgical History:  Procedure Laterality Date   SHOULDER ARTHROSCOPY WITH ROTATOR CUFF REPAIR AND SUBACROMIAL DECOMPRESSION Left 02/09/2022   Procedure: LEFT SHOULDER ARTHROSCOPY, EXTENSIVE DEBRIDEMENT, SUBACROMIAL DECOMPRESSION;  Surgeon: Tarry Kos, MD;  Location: [facility];  Service: Orthopedics;  Laterality: Left;   TUBAL LIGATION     Patient Active Problem List   Diagnosis Date Noted   Superior labrum anterior-to-posterior (SLAP) tear of right shoulder 09/29/2021   Mild cognitive impairment 07/06/2021   Memory loss 04/03/2021   Pure hypercholesterolemia 04/23/2019   Type 2 diabetes mellitus without complication, without long-term current use of insulin (HCC) 04/23/2019   Benign essential HTN 04/17/2019   Cough variant asthma vs UACS 09/23/2018    REFERRING DIAG: Z98.890 (ICD-10-CM) - S/P arthroscopy of left shoulder   THERAPY DIAG:  Acute pain of left shoulder  Muscle weakness (generalized)  Rationale for Evaluation and Treatment Rehabilitation  PERTINENT HISTORY: Asthma, DM, Hyperlipidemia HTN. Mild cognitive impairment  Presently LEFT SHOULDER ARTHROSCOPY, EXTENSIVE DEBRIDEMENT, SUBACROMIAL DECOMPRESSION;  Superior labrum anterior-to-posterior (SLAP) tear of right shoulder   PRECAUTIONS: Shoulder No lifting, no resistance over biceps  tenotomy PROM initially   SUBJECTIVE: My pain is better. 5/10 today. I have been doing the exercises.   PAIN:  Are you having pain? Yes: NPRS scale: 5/10 Pain location: Left shoulder Pain description: sharp pain Aggravating factors: moving it.cant sleep on my arm, cannot pick up any items with arm Relieving factors: medication, utilizing pillow   OBJECTIVE: (objective measures completed at initial evaluation unless otherwise dated)   DIAGNOSTIC FINDINGS:  FINDINGS: 4-20-22There is no evidence of fracture or dislocation. There is no evidence of arthropathy or other focal bone abnormality. Soft tissues are unremarkable.   IMPRESSION: Negative.   PATIENT SURVEYS:  FOTO 32% eval  57% predicted   COGNITION:           Overall cognitive status: Within functional limits for tasks assessed  mild cognitive impairment in history  with daughter for eval                                  SENSATION: WFL Light touch: Impaired  pt with some numbness into Left forearm    POSTURE: Forward head and rounded shoulders left more forward than Right   UPPER EXTREMITY ROM:    Active/ Passive ROM Right eval Left eval Left 03/01/22  Shoulder flexion 160/P 165 50/P-90 P 100  Shoulder extension 35     Shoulder abduction 157 50- 80 P 80  Shoulder adduction       Shoulder internal rotation 90 P30   Shoulder external rotation 60  P 0 P45   Elbow flexion       Elbow extension       Wrist flexion       Wrist extension       Wrist ulnar deviation       Wrist radial deviation       Wrist pronation       Wrist supination       (Blank rows = not tested)   UPPER EXTREMITY MMT:       Not tested due to post surgery   MMT Right eval Left eval  Shoulder flexion      Shoulder extension      Shoulder abduction      Shoulder adduction      Shoulder internal rotation      Shoulder external rotation      Middle trapezius      Lower trapezius      Elbow flexion      Elbow extension      Wrist  flexion      Wrist extension      Wrist ulnar deviation      Wrist radial deviation      Wrist pronation      Wrist supination      Grip strength (lbs)      (Blank rows = not tested)   SHOULDER SPECIAL TESTS:            NT due to recent shld arthroscopy biceps tenotomy and SAD   JOINT MOBILITY TESTING:  NT due to recent surgery   PALPATION:  TTP over incision and over Left biceps muscle belly             TODAY'S TREATMENT:   [facility] Adult PT Treatment:                                                DATE: 03/01/22 Therapeutic Exercise: Standing pendulums 3 way per HEP Standing flexion stretch- hands on high table with step back x 8 +HEP Standing scap squeeze +HEP Seated PROM abduction- too painful Seated ER iso 5 sec x 5 - manual assist from PTA Seated IR iso  5 sec x 5 - manual assist from PTA  Manual Therapy: Passive ROM flexion, scap, abduct, ER, IR to tolerance- pt has difficulty relaxing     02-27-22 Eval and manual PROM/ issue pendulum exercises     PATIENT EDUCATION: Education details: POC Explanation of findings issue HEP,  Person educated: Patient and Child(ren) Education method: Explanation, Demonstration, Tactile cues, Verbal cues, and Handouts Education comprehension: verbalized understanding, returned demonstration, verbal cues required, tactile cues required, and needs further education     HOME EXERCISE PROGRAM: Access Code: [email or web address] URL: [email or web address] Date: 02/27/2022 Prepared by: Garen Lah   Exercises - Circular Shoulder Pendulum with Table Support  - 3 x daily - 7 x weekly - 2 sets - 10 reps - Flexion-Extension Shoulder Pendulum with Table Support  - 3 x daily - 7 x weekly - 2 sets - 10 reps - Horizontal Shoulder Pendulum with Table Support  - 3 x daily - 7 x weekly - 2 sets - 10 reps   Patient Education - Ice - Heat   ASSESSMENT:   CLINICAL IMPRESSION: Patient is a 66  y.o. female who was seen today for physical  therapy treatment for -post left  shoulder arthroscopic debridement, biceps tenotomy and subacromial decompression.Reviewed HEP and progressed with self PROM and scap activation. Manual PROM performed in all planes. She has end range pain as expected.  Pt will benefit from skilled PT to address impairment and rehab in order to return to functional use of arm in order to return to her routine of praying in prayer position and carrying her grandson     OBJECTIVE IMPAIRMENTS decreased ROM, decreased strength, impaired UE functional use, postural dysfunction, and pain.    ACTIVITY LIMITATIONS carrying, lifting, sleeping, bathing, toileting, dressing, reach over head, and caring for others not being able to lift 30 # grandson   PARTICIPATION LIMITATIONS:  cannot lift grandson and do housework   PERSONAL FACTORS Asthma, DM, Hyperlipidemia HTN. Mild cognitive impairment  are also affecting patient's functional outcome.    REHAB POTENTIAL: Good   CLINICAL DECISION MAKING: Evolving/moderate complexity   EVALUATION COMPLEXITY: Moderate     GOALS: Goals reviewed with patient? Yes   SHORT TERM GOALS: Target date: 03/27/2022  (Remove Blue Hyperlink)   Pt will be independent with initial HEP Baseline:noknowledge Goal status: INITIAL   2.  Pt will gain PROM in Left shld flexion to at least 140 degrees Baseline: eval 90 degrees PROM Goal status: INITIAL   3.  Pt will be able to sleep throughout night for at least 4 hours without waking utilizing pillows and positioning for  left shld comform Baseline: wakes at least 3 times a night and cannot sleep on left side Goal status: INITIAL       LONG TERM GOALS: Target date: 04/24/2022  (Remove Blue Hyperlink)   Pt will be independent with advanced HEP Baseline: No knowledge Goal status: INITIAL   2.  Pt will be able to hold grandson who is 30 # and carry without exacerbating pain in Left shoulder Baseline: Unable to lift any object or have any  resistance at eval Goal status: INITIAL   3.  Pt will be able to sleep on Left side without waking during the night for at least 4-5 hours of uninterrupted sleep. Baseline: Pt wakes at least 3 time a night due to pain in left shld /unable to lie of left shld Goal status: INITIAL   4.  Pt will be able to perform household chores like sweeping the floor and cooking  with pain 2/10 or less Baseline: unable to perform any household chores Goal status: INITIAL   5.  Pt will be able to perform prayers in childs pose without exacerbating pain in arms Baseline: unable to pray in Muslim prayer posture due to arm pain and decreased AROM Goal status: INITIAL   6.  FOTO will improve from 32%   to  57%     indicating improved functional mobility . Baseline: eval 32% Goal status: INITIAL   7. Pt will be able to lift dishes above head to put in cabinet in order to complete kitchen work.            Baseline:  unable to lift left UE            GOAL status INITIAL     PLAN: PT FREQUENCY: 2x/week   PT DURATION: 8 weeks   PLANNED INTERVENTIONS: Therapeutic exercises, Therapeutic activity, Neuromuscular re-education, Patient/Family education, Joint mobilization, Dry Needling, Electrical stimulation, Cryotherapy, Moist heat, Taping, Ionotophoresis 4mg /ml Dexamethasone, Manual therapy, and Re-evaluation   PLAN FOR NEXT SESSION: PROM and review pendulum exercises. ? Isometric as pt tolerates  Jannette Spanner, PTA 03/01/22 1:13 PM Phone: [phone] Fax: [phone]

## 2022-03-02 ENCOUNTER — Ambulatory Visit: Payer: 59

## 2022-03-05 ENCOUNTER — Encounter: Payer: Self-pay | Admitting: Physical Therapy

## 2022-03-05 ENCOUNTER — Ambulatory Visit: Payer: 59 | Admitting: Physical Therapy

## 2022-03-05 DIAGNOSIS — M25512 Pain in left shoulder: Secondary | ICD-10-CM

## 2022-03-05 DIAGNOSIS — M6281 Muscle weakness (generalized): Secondary | ICD-10-CM

## 2022-03-05 NOTE — Therapy (Signed)
OUTPATIENT PHYSICAL THERAPY TREATMENT NOTE   Patient Name: Gabriella Norman MRN: [id number] DOB:Oct 12, 1955, 66 y.o., female Today's Date: 03/05/2022 PCP: Collene Mares, [place]   REFERRING PROVIDER: Cristie Hem, PA-C  END OF SESSION:   PT End of Session - 03/05/22 1407     Visit Number 3    Number of Visits 16    Date for PT Re-Evaluation 04/24/22    Authorization Type [employer]    PT Start Time 1405    PT Stop Time 1443    PT Time Calculation (min) 38 min             Past Medical History:  Diagnosis Date   Asthma    DM (diabetes mellitus) (HCC)    Hyperlipidemia    Hypertension    Past Surgical History:  Procedure Laterality Date   SHOULDER ARTHROSCOPY WITH ROTATOR CUFF REPAIR AND SUBACROMIAL DECOMPRESSION Left 02/09/2022   Procedure: LEFT SHOULDER ARTHROSCOPY, EXTENSIVE DEBRIDEMENT, SUBACROMIAL DECOMPRESSION;  Surgeon: Tarry Kos, MD;  Location: [facility];  Service: Orthopedics;  Laterality: Left;   TUBAL LIGATION     Patient Active Problem List   Diagnosis Date Noted   Superior labrum anterior-to-posterior (SLAP) tear of right shoulder 09/29/2021   Mild cognitive impairment 07/06/2021   Memory loss 04/03/2021   Pure hypercholesterolemia 04/23/2019   Type 2 diabetes mellitus without complication, without long-term current use of insulin (HCC) 04/23/2019   Benign essential HTN 04/17/2019   Cough variant asthma vs UACS 09/23/2018    REFERRING DIAG: Z98.890 (ICD-10-CM) - S/P arthroscopy of left shoulder   THERAPY DIAG:  Acute pain of left shoulder  Muscle weakness (generalized)  Rationale for Evaluation and Treatment Rehabilitation  PERTINENT HISTORY: Asthma, DM, Hyperlipidemia HTN. Mild cognitive impairment  Presently LEFT SHOULDER ARTHROSCOPY, EXTENSIVE DEBRIDEMENT, SUBACROMIAL DECOMPRESSION;  Superior labrum anterior-to-posterior (SLAP) tear of right shoulder   PRECAUTIONS: Shoulder No lifting, no resistance over biceps  tenotomy PROM initially   SUBJECTIVE: My pain is not worse than last time. 5/10. I have still been doing the pendulums I did not start the new exercises. I was not in more pain after last visit.   PAIN:  Are you having pain? Yes: NPRS scale: 5/10 Pain location: Left shoulder Pain description: sharp pain Aggravating factors: moving it.cant sleep on my arm, cannot pick up any items with arm Relieving factors: medication, utilizing pillow   OBJECTIVE: (objective measures completed at initial evaluation unless otherwise dated)   DIAGNOSTIC FINDINGS:  FINDINGS: 4-20-22There is no evidence of fracture or dislocation. There is no evidence of arthropathy or other focal bone abnormality. Soft tissues are unremarkable.   IMPRESSION: Negative.   PATIENT SURVEYS:  FOTO 32% eval  57% predicted   COGNITION:           Overall cognitive status: Within functional limits for tasks assessed  mild cognitive impairment in history  with daughter for eval                                  SENSATION: WFL Light touch: Impaired  pt with some numbness into Left forearm    POSTURE: Forward head and rounded shoulders left more forward than Right   UPPER EXTREMITY ROM:    Active/ Passive ROM Right eval Left eval Left 03/01/22 Left 03/05/22  Shoulder flexion 160/P 165 50/P-90 P 100 P 125  Shoulder extension 35      Shoulder abduction  157 50- 80 P 80 P90  Shoulder adduction        Shoulder internal rotation 90 P30    Shoulder external rotation 60 P 0 P45  P60  Elbow flexion        Elbow extension        Wrist flexion        Wrist extension        Wrist ulnar deviation        Wrist radial deviation        Wrist pronation        Wrist supination        (Blank rows = not tested)   UPPER EXTREMITY MMT:       Not tested due to post surgery   MMT Right eval Left eval  Shoulder flexion      Shoulder extension      Shoulder abduction      Shoulder adduction      Shoulder internal rotation       Shoulder external rotation      Middle trapezius      Lower trapezius      Elbow flexion      Elbow extension      Wrist flexion      Wrist extension      Wrist ulnar deviation      Wrist radial deviation      Wrist pronation      Wrist supination      Grip strength (lbs)      (Blank rows = not tested)   SHOULDER SPECIAL TESTS:            NT due to recent shld arthroscopy biceps tenotomy and SAD   JOINT MOBILITY TESTING:  NT due to recent surgery   PALPATION:  TTP over incision and over Left biceps muscle belly             TODAY'S TREATMENT:  [facility] Adult PT Treatment:                                                DATE: 03/05/22 Therapeutic Exercise: Standing pendulums 3 way per HEP Standing flexion stretch- hands on high table with step back x 8 Standing scap squeeze  Seated AAROM ER with dowel 10 x 2  Seated abduction slides at table- x 10 +HEP Seated flexion slides at table x 10  +HEP   Manual Therapy: Passive ROM flexion, scap, abduct, ER, IR to tolerance- pt has difficulty relaxing    [facility] Adult PT Treatment:                                                DATE: 03/01/22 Therapeutic Exercise: Standing pendulums 3 way per HEP Standing flexion stretch- hands on high table with step back x 8 +HEP Standing scap squeeze +HEP Seated PROM abduction- too painful Seated ER iso 5 sec x 5 - manual assist from PTA Seated IR iso  5 sec x 5 - manual assist from PTA  Manual Therapy: Passive ROM flexion, scap, abduct, ER, IR to tolerance- pt has difficulty relaxing     02-27-22 Eval and manual PROM/ issue pendulum exercises     PATIENT EDUCATION: Education details:  POC Explanation of findings issue HEP,  Person educated: Patient and Child(ren) Education method: Explanation, Demonstration, Tactile cues, Verbal cues, and Handouts Education comprehension: verbalized understanding, returned demonstration, verbal cues required, tactile cues required, and needs further  education     HOME EXERCISE PROGRAM: Access Code: [email or web address] URL: [email or web address] Date: 02/27/2022 Prepared by: Garen Lah   Exercises - Circular Shoulder Pendulum with Table Support  - 3 x daily - 7 x weekly - 2 sets - 10 reps - Flexion-Extension Shoulder Pendulum with Table Support  - 3 x daily - 7 x weekly - 2 sets - 10 reps - Horizontal Shoulder Pendulum with Table Support  - 3 x daily - 7 x weekly - 2 sets - 10 reps   Patient Education - Ice - Heat   ASSESSMENT:   CLINICAL IMPRESSION: Patient is a 66  y.o. female who was seen today for physical therapy treatment for -post left shoulder arthroscopic debridement, biceps tenotomy and subacromial decompression. Reviewed HEP and progressed with self PROM, AAROM and scap activation. Manual PROM performed in all planes. She has end range pain as expected bit tolerated passive ROM with improved tolerance.  Pt will benefit from skilled PT to address impairment and rehab in order to return to functional use of arm in order to return to her routine of praying in prayer position and carrying her grandson     OBJECTIVE IMPAIRMENTS decreased ROM, decreased strength, impaired UE functional use, postural dysfunction, and pain.    ACTIVITY LIMITATIONS carrying, lifting, sleeping, bathing, toileting, dressing, reach over head, and caring for others not being able to lift 30 # grandson   PARTICIPATION LIMITATIONS:  cannot lift grandson and do housework   PERSONAL FACTORS Asthma, DM, Hyperlipidemia HTN. Mild cognitive impairment  are also affecting patient's functional outcome.    REHAB POTENTIAL: Good   CLINICAL DECISION MAKING: Evolving/moderate complexity   EVALUATION COMPLEXITY: Moderate     GOALS: Goals reviewed with patient? Yes   SHORT TERM GOALS: Target date: 03/27/2022  (Remove Blue Hyperlink)   Pt will be independent with initial HEP Baseline:noknowledge Goal status: INITIAL   2.  Pt will gain PROM in  Left shld flexion to at least 140 degrees Baseline: eval 90 degrees PROM Goal status: INITIAL   3.  Pt will be able to sleep throughout night for at least 4 hours without waking utilizing pillows and positioning for  left shld comform Baseline: wakes at least 3 times a night and cannot sleep on left side Goal status: INITIAL       LONG TERM GOALS: Target date: 04/24/2022  (Remove Blue Hyperlink)   Pt will be independent with advanced HEP Baseline: No knowledge Goal status: INITIAL   2.  Pt will be able to hold grandson who is 30 # and carry without exacerbating pain in Left shoulder Baseline: Unable to lift any object or have any resistance at eval Goal status: INITIAL   3.  Pt will be able to sleep on Left side without waking during the night for at least 4-5 hours of uninterrupted sleep. Baseline: Pt wakes at least 3 time a night due to pain in left shld /unable to lie of left shld Goal status: INITIAL   4.  Pt will be able to perform household chores like sweeping the floor and cooking  with pain 2/10 or less Baseline: unable to perform any household chores Goal status: INITIAL   5.  Pt will be able to perform prayers in childs pose without  exacerbating pain in arms Baseline: unable to pray in Muslim prayer posture due to arm pain and decreased AROM Goal status: INITIAL   6.  FOTO will improve from 32%   to  57%     indicating improved functional mobility . Baseline: eval 32% Goal status: INITIAL   7. Pt will be able to lift dishes above head to put in cabinet in order to complete kitchen work.            Baseline:  unable to lift left UE            GOAL status INITIAL     PLAN: PT FREQUENCY: 2x/week   PT DURATION: 8 weeks   PLANNED INTERVENTIONS: Therapeutic exercises, Therapeutic activity, Neuromuscular re-education, Patient/Family education, Joint mobilization, Dry Needling, Electrical stimulation, Cryotherapy, Moist heat, Taping, Ionotophoresis 4mg /ml Dexamethasone,  Manual therapy, and Re-evaluation   PLAN FOR NEXT SESSION: PROM  Assess response to additions to HEP   , PTA 03/05/22 2:49 PM Phone: [phone] Fax: [phone]

## 2022-03-09 ENCOUNTER — Encounter: Payer: Self-pay | Admitting: Physical Therapy

## 2022-03-09 ENCOUNTER — Ambulatory Visit: Payer: 59 | Admitting: Physical Therapy

## 2022-03-09 DIAGNOSIS — M25512 Pain in left shoulder: Secondary | ICD-10-CM | POA: Diagnosis not present

## 2022-03-09 DIAGNOSIS — M6281 Muscle weakness (generalized): Secondary | ICD-10-CM

## 2022-03-12 ENCOUNTER — Encounter: Payer: Self-pay | Admitting: Physical Therapy

## 2022-03-12 ENCOUNTER — Ambulatory Visit: Payer: 59 | Admitting: Physical Therapy

## 2022-03-12 DIAGNOSIS — M25512 Pain in left shoulder: Secondary | ICD-10-CM

## 2022-03-12 DIAGNOSIS — M6281 Muscle weakness (generalized): Secondary | ICD-10-CM

## 2022-03-12 NOTE — Therapy (Signed)
OUTPATIENT PHYSICAL THERAPY TREATMENT NOTE   Patient Name: Gabriella Norman MRN: [id number] DOB:Aug 17, 1956, 66 y.o., female Today's Date: 03/12/2022 PCP: Collene Mares, [place]   REFERRING PROVIDER: Cristie Hem, PA-C  END OF SESSION:   PT End of Session - 03/12/22 1033     Visit Number 5    Number of Visits 16    Date for PT Re-Evaluation 04/24/22    Authorization Type [employer]    PT Start Time 1030   15 min late   PT Stop Time 1100    PT Time Calculation (min) 30 min             Past Medical History:  Diagnosis Date   Asthma    DM (diabetes mellitus) (HCC)    Hyperlipidemia    Hypertension    Past Surgical History:  Procedure Laterality Date   SHOULDER ARTHROSCOPY WITH ROTATOR CUFF REPAIR AND SUBACROMIAL DECOMPRESSION Left 02/09/2022   Procedure: LEFT SHOULDER ARTHROSCOPY, EXTENSIVE DEBRIDEMENT, SUBACROMIAL DECOMPRESSION;  Surgeon: Tarry Kos, MD;  Location: [facility];  Service: Orthopedics;  Laterality: Left;   TUBAL LIGATION     Patient Active Problem List   Diagnosis Date Noted   Superior labrum anterior-to-posterior (SLAP) tear of right shoulder 09/29/2021   Mild cognitive impairment 07/06/2021   Memory loss 04/03/2021   Pure hypercholesterolemia 04/23/2019   Type 2 diabetes mellitus without complication, without long-term current use of insulin (HCC) 04/23/2019   Benign essential HTN 04/17/2019   Cough variant asthma vs UACS 09/23/2018    REFERRING DIAG: Z98.890 (ICD-10-CM) - S/P arthroscopy of left shoulder   THERAPY DIAG:  Acute pain of left shoulder  Muscle weakness (generalized)  Rationale for Evaluation and Treatment Rehabilitation  PERTINENT HISTORY: Asthma, DM, Hyperlipidemia HTN. Mild cognitive impairment  Presently LEFT SHOULDER ARTHROSCOPY, EXTENSIVE DEBRIDEMENT, SUBACROMIAL DECOMPRESSION;  Superior labrum anterior-to-posterior (SLAP) tear of right shoulder   PRECAUTIONS: Shoulder No lifting, no resistance  over biceps tenotomy PROM initially   SUBJECTIVE: The shoulder is getting better.  PAIN:  Are you having pain? Yes: NPRS scale: 4/10 Pain location: Left shoulder Pain description: sharp pain Aggravating factors: moving it.cant sleep on my arm, cannot pick up any items with arm Relieving factors: medication, utilizing pillow   OBJECTIVE: (objective measures completed at initial evaluation unless otherwise dated)   DIAGNOSTIC FINDINGS:  FINDINGS: 4-20-22There is no evidence of fracture or dislocation. There is no evidence of arthropathy or other focal bone abnormality. Soft tissues are unremarkable.   IMPRESSION: Negative.   PATIENT SURVEYS:  FOTO 32% eval  57% predicted   COGNITION:           Overall cognitive status: Within functional limits for tasks assessed  mild cognitive impairment in history  with daughter for eval                                  SENSATION: WFL Light touch: Impaired  pt with some numbness into Left forearm    POSTURE: Forward head and rounded shoulders left more forward than Right   UPPER EXTREMITY ROM:    Active/ Passive ROM Right eval Left eval Left 03/01/22 Left 03/05/22 Left  03/09/22 Left 03/12/22  Shoulder flexion 160/P 165 50/P-90 P 100 P 125 A 95 P130  Shoulder extension 35        Shoulder abduction 157 50- 80 P 80 P90 A 55 P110  Shoulder adduction  Shoulder internal rotation 90 P30      Shoulder external rotation 60 P 0 P45  P60  P80 @ 45  Elbow flexion          Elbow extension          Wrist flexion          Wrist extension          Wrist ulnar deviation          Wrist radial deviation          Wrist pronation          Wrist supination          (Blank rows = not tested)   UPPER EXTREMITY MMT:       Not tested due to post surgery   MMT Right eval Left eval  Shoulder flexion      Shoulder extension      Shoulder abduction      Shoulder adduction      Shoulder internal rotation      Shoulder external rotation       Middle trapezius      Lower trapezius      Elbow flexion      Elbow extension      Wrist flexion      Wrist extension      Wrist ulnar deviation      Wrist radial deviation      Wrist pronation      Wrist supination      Grip strength (lbs)      (Blank rows = not tested)   SHOULDER SPECIAL TESTS:            NT due to recent shld arthroscopy biceps tenotomy and SAD   JOINT MOBILITY TESTING:  NT due to recent surgery   PALPATION:  TTP over incision and over Left biceps muscle belly             TODAY'S TREATMENT:  [facility] Adult PT Treatment:                                                DATE: 03/12/22 Therapeutic Exercise: Standing pendulums 3 way per HEP Isometrics flexion, Er , IR Ext, abduction 5 sec x 10 each  Standing wall slides AAROM Supine chest press   Manual Therapy: Passive ROM flexion, scap, abduct, ER, IR to tolerance- pt has difficulty relaxing  [facility] Adult PT Treatment:                                                DATE: 03/09/22 Therapeutic Exercise: Standing pendulums 3 way per HEP Standing flexion stretch- hands on high table with step back x 8 Standing scap squeeze  Seated AAROM ER with dowel 10 x 2  Seated abduction slides at table- x 10 Seated flexion slides at table x 10  Supine clasped hand chest press x 10  Manual Therapy: Passive ROM flexion, scap, abduct, ER, IR to tolerance- pt has difficulty relaxing   [facility] Adult PT Treatment:  DATE: 03/05/22 Therapeutic Exercise: Standing pendulums 3 way per HEP Standing flexion stretch- hands on high table with step back x 8 Standing scap squeeze  Seated AAROM ER with dowel 10 x 2  Seated abduction slides at table- x 10 +HEP Seated flexion slides at table x 10  +HEP   Manual Therapy: Passive ROM flexion, scap, abduct, ER, IR to tolerance- pt has difficulty relaxing   [facility] Adult PT Treatment:                                                DATE:  03/05/22 Therapeutic Exercise: Standing pendulums 3 way per HEP Standing flexion stretch- hands on high table with step back x 8 Standing scap squeeze  Seated AAROM ER with dowel 10 x 2  Seated abduction slides at table- x 10 +HEP Seated flexion slides at table x 10  +HEP   Manual Therapy: Passive ROM flexion, scap, abduct, ER, IR to tolerance- pt has difficulty relaxing    [facility] Adult PT Treatment:                                                DATE: 03/01/22 Therapeutic Exercise: Standing pendulums 3 way per HEP Standing flexion stretch- hands on high table with step back x 8 +HEP Standing scap squeeze +HEP Seated PROM abduction- too painful Seated ER iso 5 sec x 5 - manual assist from PTA Seated IR iso  5 sec x 5 - manual assist from PTA  Manual Therapy: Passive ROM flexion, scap, abduct, ER, IR to tolerance- pt has difficulty relaxing     02-27-22 Eval and manual PROM/ issue pendulum exercises     PATIENT EDUCATION: Education details: POC Explanation of findings issue HEP,  Person educated: Patient and Child(ren) Education method: Explanation, Demonstration, Tactile cues, Verbal cues, and Handouts Education comprehension: verbalized understanding, returned demonstration, verbal cues required, tactile cues required, and needs further education     HOME EXERCISE PROGRAM: Access Code: [email or web address] URL: [email or web address] Date: 03/09/2022 Prepared by: Jannette Spanner  Exercises - Circular Shoulder Pendulum with Table Support  - 3 x daily - 7 x weekly - 2 sets - 10 reps - Flexion-Extension Shoulder Pendulum with Table Support  - 3 x daily - 7 x weekly - 2 sets - 10 reps - Horizontal Shoulder Pendulum with Table Support  - 3 x daily - 7 x weekly - 2 sets - 10 reps - Seated Scapular Retraction  - 1 x daily - 7 x weekly - 3 sets - 10 reps - Standing Shoulder and Trunk Flexion at Table  - 1 x daily - 7 x weekly - 1-2 sets - 10 reps - Seated Shoulder External  Rotation AAROM with Dowel  - 1 x daily - 7 x weekly - 2 sets - 10 reps - 5 hold - Seated Shoulder Abduction Towel Slide at Table Top with Forearm in Neutral  - 1 x daily - 7 x weekly - 3 sets - 10 reps - Seated Bilateral Shoulder Flexion Towel Slide at Table Top  - 1 x daily - 7 x weekly - 3 sets - 10 reps - Clasped hands chest press and pullover  - 1 x daily - 7 x  weekly - 1 sets - 10 reps  Patient Education - Ice - Heat   ASSESSMENT:   CLINICAL IMPRESSION: Patient is a 66  y.o. female who was seen today for physical therapy treatment for -post-op left shoulder arthroscopic debridement, biceps tenotomy and subacromial decompression. She is 4 weeks post-op. She arrived late today. Initiated sub maximal isometrics and standing AAROM with good tolerance.  Pt will benefit from skilled PT to address impairment and rehab in order to return to functional use of arm in order to return to her routine of praying in prayer position and carrying her grandson.      OBJECTIVE IMPAIRMENTS decreased ROM, decreased strength, impaired UE functional use, postural dysfunction, and pain.    ACTIVITY LIMITATIONS carrying, lifting, sleeping, bathing, toileting, dressing, reach over head, and caring for others not being able to lift 30 # grandson   PARTICIPATION LIMITATIONS:  cannot lift grandson and do housework   PERSONAL FACTORS Asthma, DM, Hyperlipidemia HTN. Mild cognitive impairment  are also affecting patient's functional outcome.    REHAB POTENTIAL: Good   CLINICAL DECISION MAKING: Evolving/moderate complexity   EVALUATION COMPLEXITY: Moderate     GOALS: Goals reviewed with patient? Yes   SHORT TERM GOALS: Target date: 03/27/2022  (Remove Blue Hyperlink)   Pt will be independent with initial HEP Baseline:noknowledge Goal status: MET 03/09/22   2.  Pt will gain PROM in Left shld flexion to at least 140 degrees Baseline: eval 90 degrees PROM Goal status: INITIAL   3.  Pt will be able to sleep  throughout night for at least 4 hours without waking utilizing pillows and positioning for  left shld comform Baseline: wakes at least 3 times a night and cannot sleep on left side Goal status: INITIAL       LONG TERM GOALS: Target date: 04/24/2022  (Remove Blue Hyperlink)   Pt will be independent with advanced HEP Baseline: No knowledge Goal status: INITIAL   2.  Pt will be able to hold grandson who is 30 # and carry without exacerbating pain in Left shoulder Baseline: Unable to lift any object or have any resistance at eval Goal status: INITIAL   3.  Pt will be able to sleep on Left side without waking during the night for at least 4-5 hours of uninterrupted sleep. Baseline: Pt wakes at least 3 time a night due to pain in left shld /unable to lie of left shld Goal status: INITIAL   4.  Pt will be able to perform household chores like sweeping the floor and cooking  with pain 2/10 or less Baseline: unable to perform any household chores Goal status: INITIAL   5.  Pt will be able to perform prayers in childs pose without exacerbating pain in arms Baseline: unable to pray in Muslim prayer posture due to arm pain and decreased AROM Goal status: INITIAL   6.  FOTO will improve from 32%   to  57%     indicating improved functional mobility . Baseline: eval 32% Goal status: INITIAL   7. Pt will be able to lift dishes above head to put in cabinet in order to complete kitchen work.            Baseline:  unable to lift left UE            GOAL status INITIAL     PLAN: PT FREQUENCY: 2x/week   PT DURATION: 8 weeks   PLANNED INTERVENTIONS: Therapeutic exercises, Therapeutic activity, Neuromuscular re-education, Patient/Family  education, Joint mobilization, Dry Needling, Electrical stimulation, Cryotherapy, Moist heat, Taping, Ionotophoresis 4mg /ml Dexamethasone, Manual therapy, and Re-evaluation   PLAN FOR NEXT SESSION: PROM  Assess response to additions to HEP, assess response to  isometrics and add to HEP    Jannette Spanner, PTA 03/12/22 11:03 AM Phone: [phone] Fax: [phone]

## 2022-03-14 ENCOUNTER — Ambulatory Visit: Payer: 59 | Admitting: Physical Therapy

## 2022-03-19 ENCOUNTER — Encounter: Payer: Self-pay | Admitting: Physical Therapy

## 2022-03-19 ENCOUNTER — Ambulatory Visit: Payer: 59 | Attending: Physician Assistant | Admitting: Physical Therapy

## 2022-03-19 DIAGNOSIS — M6281 Muscle weakness (generalized): Secondary | ICD-10-CM | POA: Diagnosis present

## 2022-03-19 DIAGNOSIS — M25512 Pain in left shoulder: Secondary | ICD-10-CM | POA: Insufficient documentation

## 2022-03-19 NOTE — Therapy (Signed)
OUTPATIENT PHYSICAL THERAPY TREATMENT NOTE   Patient Name: Gabriella Norman MRN: [id number] DOB:11-Jan-1956, 66 y.o., female Today's Date: 03/19/2022 PCP: Scheryl Marten, [place]   REFERRING PROVIDER: Aundra Dubin, PA-C  END OF SESSION:   PT End of Session - 03/19/22 0720     Visit Number 6    Number of Visits 16    Date for PT Re-Evaluation 04/24/22    Authorization Type [employer]    PT Start Time [phone]    PT Stop Time 0755    PT Time Calculation (min) 38 min             Past Medical History:  Diagnosis Date   Asthma    DM (diabetes mellitus) ([place])    Hyperlipidemia    Hypertension    Past Surgical History:  Procedure Laterality Date   SHOULDER ARTHROSCOPY WITH ROTATOR CUFF REPAIR AND SUBACROMIAL DECOMPRESSION Left 02/09/2022   Procedure: LEFT SHOULDER ARTHROSCOPY, EXTENSIVE DEBRIDEMENT, SUBACROMIAL DECOMPRESSION;  Surgeon: Leandrew Koyanagi, MD;  Location: [facility];  Service: Orthopedics;  Laterality: Left;   TUBAL LIGATION     Patient Active Problem List   Diagnosis Date Noted   Superior labrum anterior-to-posterior (SLAP) tear of right shoulder 09/29/2021   Mild cognitive impairment 07/06/2021   Memory loss 04/03/2021   Pure hypercholesterolemia 04/23/2019   Type 2 diabetes mellitus without complication, without long-term current use of insulin ([place]) 04/23/2019   Benign essential HTN 04/17/2019   Cough variant asthma vs UACS 09/23/2018    REFERRING DIAG: Z98.890 (ICD-10-CM) - S/P arthroscopy of left shoulder   THERAPY DIAG:  Acute pain of left shoulder  Muscle weakness (generalized)  Rationale for Evaluation and Treatment Rehabilitation  PERTINENT HISTORY: Asthma, DM, Hyperlipidemia HTN. Mild cognitive impairment  Presently LEFT SHOULDER ARTHROSCOPY, EXTENSIVE DEBRIDEMENT, SUBACROMIAL DECOMPRESSION;  Superior labrum anterior-to-posterior (SLAP) tear of right shoulder   PRECAUTIONS: Shoulder No lifting, no resistance over biceps  tenotomy PROM initially   SUBJECTIVE: The shoulder has been hurting me today and yesterday. I wake 2 times at night due to pain. I do not know why it hurts more. It was feeling better.  PAIN:  Are you having pain? Yes: NPRS scale: 6/10 Pain location: Left shoulder Pain description: sharp pain Aggravating factors: moving it.cant sleep on my arm, cannot pick up any items with arm Relieving factors: medication, utilizing pillow   OBJECTIVE: (objective measures completed at initial evaluation unless otherwise dated)   DIAGNOSTIC FINDINGS:  FINDINGS: 4-20-22There is no evidence of fracture or dislocation. There is no evidence of arthropathy or other focal bone abnormality. Soft tissues are unremarkable.   IMPRESSION: Negative.   PATIENT SURVEYS:  FOTO 32% eval  57% predicted   COGNITION:           Overall cognitive status: Within functional limits for tasks assessed  mild cognitive impairment in history  with daughter for eval                                  SENSATION: WFL Light touch: Impaired  pt with some numbness into Left forearm    POSTURE: Forward head and rounded shoulders left more forward than Right   UPPER EXTREMITY ROM:    Active/ Passive ROM Right eval Left eval Left 03/01/22 Left 03/05/22 Left  03/09/22 Left 03/12/22  Shoulder flexion 160/P 165 50/P-90 P 100 P 125 A 95 P130  Shoulder extension 35  Shoulder abduction 157 50- 80 P 80 P90 A 55 P110  Shoulder adduction          Shoulder internal rotation 90 P30      Shoulder external rotation 60 P 0 P45  P60  P80 @ 45  Elbow flexion          Elbow extension          Wrist flexion          Wrist extension          Wrist ulnar deviation          Wrist radial deviation          Wrist pronation          Wrist supination          (Blank rows = not tested)   UPPER EXTREMITY MMT:       Not tested due to post surgery   MMT Right eval Left eval  Shoulder flexion      Shoulder extension      Shoulder  abduction      Shoulder adduction      Shoulder internal rotation      Shoulder external rotation      Middle trapezius      Lower trapezius      Elbow flexion      Elbow extension      Wrist flexion      Wrist extension      Wrist ulnar deviation      Wrist radial deviation      Wrist pronation      Wrist supination      Grip strength (lbs)      (Blank rows = not tested)   SHOULDER SPECIAL TESTS:            NT due to recent shld arthroscopy biceps tenotomy and SAD   JOINT MOBILITY TESTING:  NT due to recent surgery   PALPATION:  TTP over incision and over Left biceps muscle belly             TODAY'S TREATMENT:  [facility] Adult PT Treatment:                                                DATE: 03/19/22 Therapeutic Exercise: Pulleys elevation x 2 minutes ~ 130 deg Shoulder row red x 15 UE ranger AAROM flexion x 10 ~ 130 deg UE ranger AAROM ER x 15 Standing cane (UE ranger) abduction ~45 deg Isometrics flexion, Er, abct, Ext Flexion wall slides 10 x 2   Manual Therapy: STW and IASTM upper arm, bicep Passive flexion, scaption, ER, IR   [facility] Adult PT Treatment:                                                DATE: 03/12/22 Therapeutic Exercise: Standing pendulums 3 way per HEP Isometrics flexion, Er , IR Ext, abduction 5 sec x 10 each  Standing wall slides AAROM Supine chest press   Manual Therapy: Passive ROM flexion, scap, abduct, ER, IR to tolerance- pt has difficulty relaxing  [facility] Adult PT Treatment:  DATE: 03/09/22 Therapeutic Exercise: Standing pendulums 3 way per HEP Standing flexion stretch- hands on high table with step back x 8 Standing scap squeeze  Seated AAROM ER with dowel 10 x 2  Seated abduction slides at table- x 10 Seated flexion slides at table x 10  Supine clasped hand chest press x 10  Manual Therapy: Passive ROM flexion, scap, abduct, ER, IR to tolerance- pt has difficulty relaxing   [facility] Adult PT  Treatment:                                                DATE: 03/05/22 Therapeutic Exercise: Standing pendulums 3 way per HEP Standing flexion stretch- hands on high table with step back x 8 Standing scap squeeze  Seated AAROM ER with dowel 10 x 2  Seated abduction slides at table- x 10 +HEP Seated flexion slides at table x 10  +HEP   Manual Therapy: Passive ROM flexion, scap, abduct, ER, IR to tolerance- pt has difficulty relaxing   [facility] Adult PT Treatment:                                                DATE: 03/05/22 Therapeutic Exercise: Standing pendulums 3 way per HEP Standing flexion stretch- hands on high table with step back x 8 Standing scap squeeze  Seated AAROM ER with dowel 10 x 2  Seated abduction slides at table- x 10 +HEP Seated flexion slides at table x 10  +HEP   Manual Therapy: Passive ROM flexion, scap, abduct, ER, IR to tolerance- pt has difficulty relaxing    [facility] Adult PT Treatment:                                                DATE: 03/01/22 Therapeutic Exercise: Standing pendulums 3 way per HEP Standing flexion stretch- hands on high table with step back x 8 +HEP Standing scap squeeze +HEP Seated PROM abduction- too painful Seated ER iso 5 sec x 5 - manual assist from PTA Seated IR iso  5 sec x 5 - manual assist from PTA  Manual Therapy: Passive ROM flexion, scap, abduct, ER, IR to tolerance- pt has difficulty relaxing     02-27-22 Eval and manual PROM/ issue pendulum exercises     PATIENT EDUCATION: Education details: POC Explanation of findings issue HEP,  Person educated: Patient and Child(ren) Education method: Explanation, Demonstration, Tactile cues, Verbal cues, and Handouts Education comprehension: verbalized understanding, returned demonstration, verbal cues required, tactile cues required, and needs further education     HOME EXERCISE PROGRAM: Access Code: [email or web address] URL: [email or web address] Date:  03/09/2022 Prepared by: Hessie Diener  Exercises - Circular Shoulder Pendulum with Table Support  - 3 x daily - 7 x weekly - 2 sets - 10 reps - Flexion-Extension Shoulder Pendulum with Table Support  - 3 x daily - 7 x weekly - 2 sets - 10 reps - Horizontal Shoulder Pendulum with Table Support  - 3 x daily - 7 x weekly - 2 sets - 10 reps - Seated Scapular Retraction  - 1 x daily -  7 x weekly - 3 sets - 10 reps - Standing Shoulder and Trunk Flexion at Table  - 1 x daily - 7 x weekly - 1-2 sets - 10 reps - Seated Shoulder External Rotation AAROM with Dowel  - 1 x daily - 7 x weekly - 2 sets - 10 reps - 5 hold - Seated Shoulder Abduction Towel Slide at Table Top with Forearm in Neutral  - 1 x daily - 7 x weekly - 3 sets - 10 reps - Seated Bilateral Shoulder Flexion Towel Slide at Table Top  - 1 x daily - 7 x weekly - 3 sets - 10 reps - Clasped hands chest press and pullover  - 1 x daily - 7 x weekly - 1 sets - 10 reps  Patient Education - Ice - Heat   ASSESSMENT:   CLINICAL IMPRESSION: Patient is a 66  y.o. female who was seen today for physical therapy treatment for -post-op left shoulder arthroscopic debridement, biceps tenotomy and subacromial decompression. She is 5 weeks post-op. She reports shoulder pain was decreasing but she has had increased pain yesterday and today for unknown reason. Repeated isometrics today and continued with AAROM. Her pain is located in anterior upper arm today. STW performed to decrease tension in upper arm. She reported decreased pain afterward. Encouraged frequent ice application and self massage to calm down pain. Her shoulder AROM was limited to 65 degrees today. No updates to HEP today.  Pt will benefit from skilled PT to address impairment and rehab in order to return to functional use of arm in order to return to her routine of praying in prayer position and carrying her grandson.      OBJECTIVE IMPAIRMENTS decreased ROM, decreased strength, impaired UE  functional use, postural dysfunction, and pain.    ACTIVITY LIMITATIONS carrying, lifting, sleeping, bathing, toileting, dressing, reach over head, and caring for others not being able to lift 30 # grandson   PARTICIPATION LIMITATIONS:  cannot lift grandson and do housework   PERSONAL FACTORS Asthma, DM, Hyperlipidemia HTN. Mild cognitive impairment  are also affecting patient's functional outcome.    REHAB POTENTIAL: Good   CLINICAL DECISION MAKING: Evolving/moderate complexity   EVALUATION COMPLEXITY: Moderate     GOALS: Goals reviewed with patient? Yes   SHORT TERM GOALS: Target date: 03/27/2022  (Remove Blue Hyperlink)   Pt will be independent with initial HEP Baseline:noknowledge Goal status: MET 03/09/22   2.  Pt will gain PROM in Left shld flexion to at least 140 degrees Baseline: eval 90 degrees PROM Goal status: INITIAL   3.  Pt will be able to sleep throughout night for at least 4 hours without waking utilizing pillows and positioning for  left shld comform Baseline: wakes at least 3 times a night and cannot sleep on left side Goal status: INITIAL       LONG TERM GOALS: Target date: 04/24/2022  (Remove Blue Hyperlink)   Pt will be independent with advanced HEP Baseline: No knowledge Goal status: INITIAL   2.  Pt will be able to hold grandson who is 30 # and carry without exacerbating pain in Left shoulder Baseline: Unable to lift any object or have any resistance at eval Goal status: INITIAL   3.  Pt will be able to sleep on Left side without waking during the night for at least 4-5 hours of uninterrupted sleep. Baseline: Pt wakes at least 3 time a night due to pain in left shld /unable to lie of  left shld Goal status: INITIAL   4.  Pt will be able to perform household chores like sweeping the floor and cooking  with pain 2/10 or less Baseline: unable to perform any household chores Goal status: INITIAL   5.  Pt will be able to perform prayers in childs pose  without exacerbating pain in arms Baseline: unable to pray in Muslim prayer posture due to arm pain and decreased AROM Goal status: INITIAL   6.  FOTO will improve from 32%   to  57%     indicating improved functional mobility . Baseline: eval 32% Goal status: INITIAL   7. Pt will be able to lift dishes above head to put in cabinet in order to complete kitchen work.            Baseline:  unable to lift left UE            GOAL status INITIAL     PLAN: PT FREQUENCY: 2x/week   PT DURATION: 8 weeks   PLANNED INTERVENTIONS: Therapeutic exercises, Therapeutic activity, Neuromuscular re-education, Patient/Family education, Joint mobilization, Dry Needling, Electrical stimulation, Cryotherapy, Moist heat, Taping, Ionotophoresis 15m/ml Dexamethasone, Manual therapy, and Re-evaluation   PLAN FOR NEXT SESSION: assess pain and response to STW, PROM  assess response to isometrics and add to HEP if tolerated.    JHessie Diener PTA 03/19/22 8:01 AM Phone: [phone]: [phone]

## 2022-03-22 ENCOUNTER — Ambulatory Visit: Payer: 59 | Admitting: Physical Therapy

## 2022-03-22 ENCOUNTER — Encounter: Payer: Self-pay | Admitting: Physical Therapy

## 2022-03-22 DIAGNOSIS — M25512 Pain in left shoulder: Secondary | ICD-10-CM | POA: Diagnosis not present

## 2022-03-22 DIAGNOSIS — M6281 Muscle weakness (generalized): Secondary | ICD-10-CM

## 2022-03-22 NOTE — Therapy (Signed)
OUTPATIENT PHYSICAL THERAPY TREATMENT NOTE   Patient Name: Gabriella Norman MRN: [id number] DOB:05/05/1956, 66 y.o., female Today's Date: 03/22/2022 PCP: Scheryl Marten, [place]   REFERRING PROVIDER: Aundra Dubin, PA-C  END OF SESSION:   PT End of Session - 03/22/22 1238     Visit Number 7    Number of Visits 16    Date for PT Re-Evaluation 04/24/22    Authorization Type [employer]    PT Start Time 1157    PT Stop Time 1313    PT Time Calculation (min) 38 min             Past Medical History:  Diagnosis Date   Asthma    DM (diabetes mellitus) ([place])    Hyperlipidemia    Hypertension    Past Surgical History:  Procedure Laterality Date   SHOULDER ARTHROSCOPY WITH ROTATOR CUFF REPAIR AND SUBACROMIAL DECOMPRESSION Left 02/09/2022   Procedure: LEFT SHOULDER ARTHROSCOPY, EXTENSIVE DEBRIDEMENT, SUBACROMIAL DECOMPRESSION;  Surgeon: Leandrew Koyanagi, MD;  Location: [facility];  Service: Orthopedics;  Laterality: Left;   TUBAL LIGATION     Patient Active Problem List   Diagnosis Date Noted   Superior labrum anterior-to-posterior (SLAP) tear of right shoulder 09/29/2021   Mild cognitive impairment 07/06/2021   Memory loss 04/03/2021   Pure hypercholesterolemia 04/23/2019   Type 2 diabetes mellitus without complication, without long-term current use of insulin ([place]) 04/23/2019   Benign essential HTN 04/17/2019   Cough variant asthma vs UACS 09/23/2018    REFERRING DIAG: Z98.890 (ICD-10-CM) - S/P arthroscopy of left shoulder   THERAPY DIAG:  Acute pain of left shoulder  Muscle weakness (generalized)  Rationale for Evaluation and Treatment Rehabilitation  PERTINENT HISTORY: Asthma, DM, Hyperlipidemia HTN. Mild cognitive impairment  Presently LEFT SHOULDER ARTHROSCOPY, EXTENSIVE DEBRIDEMENT, SUBACROMIAL DECOMPRESSION;  Superior labrum anterior-to-posterior (SLAP) tear of right shoulder   PRECAUTIONS: Shoulder No lifting, no resistance over biceps  tenotomy PROM initially   SUBJECTIVE: The shoulder feels better today.    PAIN:  Are you having pain? Yes: NPRS scale: 4/10 Pain location: Left shoulder Pain description: sharp pain Aggravating factors: moving it.cant sleep on my arm, cannot pick up any items with arm Relieving factors: medication, utilizing pillow   OBJECTIVE: (objective measures completed at initial evaluation unless otherwise dated)   DIAGNOSTIC FINDINGS:  FINDINGS: 4-20-22There is no evidence of fracture or dislocation. There is no evidence of arthropathy or other focal bone abnormality. Soft tissues are unremarkable.   IMPRESSION: Negative.   PATIENT SURVEYS:  FOTO 32% eval  57% predicted   COGNITION:           Overall cognitive status: Within functional limits for tasks assessed  mild cognitive impairment in history  with daughter for eval                                  SENSATION: WFL Light touch: Impaired  pt with some numbness into Left forearm    POSTURE: Forward head and rounded shoulders left more forward than Right   UPPER EXTREMITY ROM:    Active/ Passive ROM Right eval Left eval Left 03/01/22 Left 03/05/22 Left  03/09/22 Left 03/12/22 Left 03/22/22  Shoulder flexion 160/P 165 50/P-90 P 100 P 125 A 95 P130 A117/ P134  Shoulder extension 35         Shoulder abduction 157 50- 80 P 80 P90 A 55 P110 85  Shoulder adduction           Shoulder internal rotation 90 P30       Shoulder external rotation 60 P 0 P45  P60  P80 @ 45   Elbow flexion           Elbow extension           Wrist flexion           Wrist extension           Wrist ulnar deviation           Wrist radial deviation           Wrist pronation           Wrist supination           (Blank rows = not tested)   UPPER EXTREMITY MMT:       Not tested due to post surgery   MMT Right eval Left eval  Shoulder flexion      Shoulder extension      Shoulder abduction      Shoulder adduction      Shoulder internal rotation       Shoulder external rotation      Middle trapezius      Lower trapezius      Elbow flexion      Elbow extension      Wrist flexion      Wrist extension      Wrist ulnar deviation      Wrist radial deviation      Wrist pronation      Wrist supination      Grip strength (lbs)      (Blank rows = not tested)   SHOULDER SPECIAL TESTS:            NT due to recent shld arthroscopy biceps tenotomy and SAD   JOINT MOBILITY TESTING:  NT due to recent surgery   PALPATION:  TTP over incision and over Left biceps muscle belly             TODAY'S TREATMENT:  [facility] Adult PT Treatment:                                                DATE: 03/22/22 Therapeutic Exercise: Flexion wall slides 10 x 2  Left shoulder abduction wall slides 10 x 2  Pulleys elevation x 2 minutes ~ 130 deg Shoulder row red x 15 Isometrics flexion, Er, abct, Ext, IR- updated HEP  Manual Therapy:  PROM shoulder flexion, abduction, IR , ER   [facility] Adult PT Treatment:                                                DATE: 03/19/22 Therapeutic Exercise: Pulleys elevation x 2 minutes ~ 130 deg Shoulder row red x 15 UE ranger AAROM flexion x 10 ~ 130 deg UE ranger AAROM ER x 15 Standing cane (UE ranger) abduction ~45 deg Isometrics flexion, Er, abct, Ext Flexion wall slides 10 x 2   Manual Therapy: STW and IASTM upper arm, bicep Passive flexion, scaption, ER, IR   [facility] Adult PT Treatment:  DATE: 03/12/22 Therapeutic Exercise: Standing pendulums 3 way per HEP Isometrics flexion, Er , IR Ext, abduction 5 sec x 10 each  Standing wall slides AAROM Supine chest press   Manual Therapy: Passive ROM flexion, scap, abduct, ER, IR to tolerance- pt has difficulty relaxing  [facility] Adult PT Treatment:                                                DATE: 03/09/22 Therapeutic Exercise: Standing pendulums 3 way per HEP Standing flexion stretch- hands on high table with step back x  8 Standing scap squeeze  Seated AAROM ER with dowel 10 x 2  Seated abduction slides at table- x 10 Seated flexion slides at table x 10  Supine clasped hand chest press x 10  Manual Therapy: Passive ROM flexion, scap, abduct, ER, IR to tolerance- pt has difficulty relaxing   [facility] Adult PT Treatment:                                                DATE: 03/05/22 Therapeutic Exercise: Standing pendulums 3 way per HEP Standing flexion stretch- hands on high table with step back x 8 Standing scap squeeze  Seated AAROM ER with dowel 10 x 2  Seated abduction slides at table- x 10 +HEP Seated flexion slides at table x 10  +HEP   Manual Therapy: Passive ROM flexion, scap, abduct, ER, IR to tolerance- pt has difficulty relaxing   [facility] Adult PT Treatment:                                                DATE: 03/05/22 Therapeutic Exercise: Standing pendulums 3 way per HEP Standing flexion stretch- hands on high table with step back x 8 Standing scap squeeze  Seated AAROM ER with dowel 10 x 2  Seated abduction slides at table- x 10 +HEP Seated flexion slides at table x 10  +HEP   Manual Therapy: Passive ROM flexion, scap, abduct, ER, IR to tolerance- pt has difficulty relaxing    [facility] Adult PT Treatment:                                                DATE: 03/01/22 Therapeutic Exercise: Standing pendulums 3 way per HEP Standing flexion stretch- hands on high table with step back x 8 +HEP Standing scap squeeze +HEP Seated PROM abduction- too painful Seated ER iso 5 sec x 5 - manual assist from PTA Seated IR iso  5 sec x 5 - manual assist from PTA  Manual Therapy: Passive ROM flexion, scap, abduct, ER, IR to tolerance- pt has difficulty relaxing     02-27-22 Eval and manual PROM/ issue pendulum exercises     PATIENT EDUCATION: Education details: POC Explanation of findings issue HEP,  Person educated: Patient and Child(ren) Education method: Explanation, Demonstration, Tactile  cues, Verbal cues, and Handouts Education comprehension: verbalized understanding, returned demonstration, verbal cues required, tactile cues required, and  needs further education     HOME EXERCISE PROGRAM: Access Code: [email or web address] URL: [email or web address] Date: 03/22/2022 Prepared by: Hessie Diener  Exercises - Circular Shoulder Pendulum with Table Support  - 3 x daily - 7 x weekly - 2 sets - 10 reps - Flexion-Extension Shoulder Pendulum with Table Support  - 3 x daily - 7 x weekly - 2 sets - 10 reps - Horizontal Shoulder Pendulum with Table Support  - 3 x daily - 7 x weekly - 2 sets - 10 reps - Seated Scapular Retraction  - 1 x daily - 7 x weekly - 3 sets - 10 reps - Standing Shoulder and Trunk Flexion at Table  - 1 x daily - 7 x weekly - 1-2 sets - 10 reps - Seated Shoulder External Rotation AAROM with Dowel  - 1 x daily - 7 x weekly - 2 sets - 10 reps - 5 hold - Seated Shoulder Abduction Towel Slide at Table Top with Forearm in Neutral  - 1 x daily - 7 x weekly - 3 sets - 10 reps - Seated Bilateral Shoulder Flexion Towel Slide at Table Top  - 1 x daily - 7 x weekly - 3 sets - 10 reps - Clasped hands chest press and pullover  - 1 x daily - 7 x weekly - 1 sets - 10 reps - Isometric Shoulder Flexion at Wall  - 1 x daily - 7 x weekly - 1-2 sets - 10 reps - 5 hold - Standing Isometric Shoulder Internal Rotation at Doorway  - 1 x daily - 7 x weekly - 1-2 sets - 10 reps - 5 hold - Standing Isometric Shoulder External Rotation with Doorway  - 1 x daily - 7 x weekly - 1-2 sets - 10 reps - 5 hold - Standing Isometric Shoulder Extension with Doorway - Arm Bent  - 1 x daily - 7 x weekly - 1-2 sets - 10 reps - 5 hold - Standing Shoulder Abduction Wall Slide with Thumb Out  - 1 x daily - 7 x weekly - 1-2 sets - 10 reps  Patient Education - Ice - Heat   ASSESSMENT:   CLINICAL IMPRESSION: Patient is a 66  y.o. female who was seen today for physical therapy treatment for -post-op  left shoulder arthroscopic debridement, biceps tenotomy and subacromial decompression. She is 5 weeks and 6 days post-op. Her pain is much improved since last visit. AROM much improved today as well.  Improved tolerance to AAROM and isometrics today. Able to update HEP. Pt will benefit from skilled PT to address impairment and rehab in order to return to functional use of arm in order to return to her routine of praying in prayer position and carrying her grandson. STG# 1 met and making progress toward remaining STGs.      OBJECTIVE IMPAIRMENTS decreased ROM, decreased strength, impaired UE functional use, postural dysfunction, and pain.    ACTIVITY LIMITATIONS carrying, lifting, sleeping, bathing, toileting, dressing, reach over head, and caring for others not being able to lift 30 # grandson   PARTICIPATION LIMITATIONS:  cannot lift grandson and do housework   PERSONAL FACTORS Asthma, DM, Hyperlipidemia HTN. Mild cognitive impairment  are also affecting patient's functional outcome.    REHAB POTENTIAL: Good   CLINICAL DECISION MAKING: Evolving/moderate complexity   EVALUATION COMPLEXITY: Moderate     GOALS: Goals reviewed with patient? Yes   SHORT TERM GOALS: Target date: 03/27/2022  (Remove Blue Hyperlink)   Pt will be independent  with initial HEP Baseline:noknowledge Goal status: MET 03/09/22   2.  Pt will gain PROM in Left shld flexion to at least 140 degrees Baseline: eval 90 degrees PROM Status 03/22/22: 134 flexion PROM Goal status: ONGOING   3.  Pt will be able to sleep throughout night for at least 4 hours without waking utilizing pillows and positioning for  left shld comform Baseline: wakes at least 3 times a night and cannot sleep on left side Status: 03/22/22: wakes 2 x per night due to pain  Goal status: ONGOING       LONG TERM GOALS: Target date: 04/24/2022  (Remove Blue Hyperlink)   Pt will be independent with advanced HEP Baseline: No knowledge Goal status:  INITIAL   2.  Pt will be able to hold grandson who is 30 # and carry without exacerbating pain in Left shoulder Baseline: Unable to lift any object or have any resistance at eval Goal status: INITIAL   3.  Pt will be able to sleep on Left side without waking during the night for at least 4-5 hours of uninterrupted sleep. Baseline: Pt wakes at least 3 time a night due to pain in left shld /unable to lie of left shld Goal status: INITIAL   4.  Pt will be able to perform household chores like sweeping the floor and cooking  with pain 2/10 or less Baseline: unable to perform any household chores Goal status: INITIAL   5.  Pt will be able to perform prayers in childs pose without exacerbating pain in arms Baseline: unable to pray in Muslim prayer posture due to arm pain and decreased AROM Goal status: INITIAL   6.  FOTO will improve from 32%   to  57%     indicating improved functional mobility . Baseline: eval 32% Goal status: INITIAL   7. Pt will be able to lift dishes above head to put in cabinet in order to complete kitchen work.            Baseline:  unable to lift left UE            GOAL status INITIAL     PLAN: PT FREQUENCY: 2x/week   PT DURATION: 8 weeks   PLANNED INTERVENTIONS: Therapeutic exercises, Therapeutic activity, Neuromuscular re-education, Patient/Family education, Joint mobilization, Dry Needling, Electrical stimulation, Cryotherapy, Moist heat, Taping, Ionotophoresis 4m/ml Dexamethasone, Manual therapy, and Re-evaluation   PLAN FOR NEXT SESSION: review isometrics and continue AAROM, PROM   JHessie Diener PTA 03/22/22 2:10 PM Phone: [phone]: [phone]

## 2022-03-27 ENCOUNTER — Encounter: Payer: Self-pay | Admitting: Physical Therapy

## 2022-03-27 ENCOUNTER — Ambulatory Visit: Payer: 59 | Admitting: Physical Therapy

## 2022-03-27 DIAGNOSIS — M25512 Pain in left shoulder: Secondary | ICD-10-CM | POA: Diagnosis not present

## 2022-03-27 DIAGNOSIS — M6281 Muscle weakness (generalized): Secondary | ICD-10-CM

## 2022-03-27 NOTE — Therapy (Signed)
OUTPATIENT PHYSICAL THERAPY TREATMENT NOTE   Patient Name: Gabriella Norman MRN: [id number] DOB:07-18-1956, 66 y.o., female Today's Date: 03/29/2022 PCP: Collene Mares, [place]   REFERRING PROVIDER: Cristie Hem, PA-C  END OF SESSION:   PT End of Session - 03/29/22 1047     Visit Number 9    Number of Visits 16    Date for PT Re-Evaluation 04/24/22    Authorization Type [employer]    PT Start Time 1016    PT Stop Time 1100    PT Time Calculation (min) 44 min    Activity Tolerance Patient tolerated treatment well;Patient limited by pain    Behavior During Therapy WFL for tasks assessed/performed              Past Medical History:  Diagnosis Date   Asthma    DM (diabetes mellitus) (HCC)    Hyperlipidemia    Hypertension    Past Surgical History:  Procedure Laterality Date   SHOULDER ARTHROSCOPY WITH ROTATOR CUFF REPAIR AND SUBACROMIAL DECOMPRESSION Left 02/09/2022   Procedure: LEFT SHOULDER ARTHROSCOPY, EXTENSIVE DEBRIDEMENT, SUBACROMIAL DECOMPRESSION;  Surgeon: Tarry Kos, MD;  Location: [facility];  Service: Orthopedics;  Laterality: Left;   TUBAL LIGATION     Patient Active Problem List   Diagnosis Date Noted   Superior labrum anterior-to-posterior (SLAP) tear of right shoulder 09/29/2021   Mild cognitive impairment 07/06/2021   Memory loss 04/03/2021   Pure hypercholesterolemia 04/23/2019   Type 2 diabetes mellitus without complication, without long-term current use of insulin (HCC) 04/23/2019   Benign essential HTN 04/17/2019   Cough variant asthma vs UACS 09/23/2018    REFERRING DIAG: Z98.890 (ICD-10-CM) - S/P arthroscopy of left shoulder   THERAPY DIAG:  Acute pain of left shoulder  Muscle weakness (generalized)  Rationale for Evaluation and Treatment Rehabilitation  PERTINENT HISTORY: Asthma, DM, Hyperlipidemia HTN. Mild cognitive impairment  Presently LEFT SHOULDER ARTHROSCOPY, EXTENSIVE DEBRIDEMENT, SUBACROMIAL  DECOMPRESSION;  Superior labrum anterior-to-posterior (SLAP) tear of right shoulder   PRECAUTIONS: Shoulder No lifting, no resistance over biceps tenotomy PROM initially   SUBJECTIVE: The shoulder feels a little better. I am doing the exercise. I still have pain but I am a little better today.  PAIN:  Are you having pain? Yes: NPRS scale: 4/10 Pain location: Left shoulder Pain description: sharp pain Aggravating factors: moving it.cant sleep on my arm, cannot pick up any items with arm Relieving factors: medication, utilizing pillow   OBJECTIVE: (objective measures completed at initial evaluation unless otherwise dated)   DIAGNOSTIC FINDINGS:  FINDINGS: 4-20-22There is no evidence of fracture or dislocation. There is no evidence of arthropathy or other focal bone abnormality. Soft tissues are unremarkable.   IMPRESSION: Negative.   PATIENT SURVEYS:  FOTO 32% eval  57% predicted FOTO status 03/27/22: 52%   COGNITION:           Overall cognitive status: Within functional limits for tasks assessed  mild cognitive impairment in history  with daughter for eval                                  SENSATION: WFL Light touch: Impaired  pt with some numbness into Left forearm    POSTURE: Forward head and rounded shoulders left more forward than Right   UPPER EXTREMITY ROM:    Active/ Passive ROM Right eval Left eval Left 03/01/22 Left 03/05/22 Left  03/09/22 Left 03/12/22  Left 03/22/22 Left 03/27/22 Left 03-27-22  Shoulder flexion 160/P 165 50/P-90 P 100 P 125 A 95 P130 A117/ P134 P 140 P143 Pain  Shoulder extension 35           Shoulder abduction 157 50- 80 P 80 P90 A 55 P110 85  85 Pain  Shoulder adduction             Shoulder internal rotation 90 P30       To L1 with pain  Shoulder external rotation 60 P 0 P45  P60  P80 @ 45     Elbow flexion             Elbow extension             Wrist flexion             Wrist extension             Wrist ulnar deviation              Wrist radial deviation             Wrist pronation             Wrist supination             (Blank rows = not tested)   UPPER EXTREMITY MMT:       Not tested due to post surgery Pt 5/5 on R   MMT Right eval Left eval Left 03-29-22  Shoulder flexion     4-  Shoulder extension     4-  Shoulder abduction     4-  Shoulder adduction       Shoulder internal rotation     3+  Shoulder external rotation     4-  Middle trapezius       Lower trapezius       Elbow flexion       Elbow extension       Wrist flexion       Wrist extension       Wrist ulnar deviation       Wrist radial deviation       Wrist pronation       Wrist supination       Grip strength (lbs)       (Blank rows = not tested)   SHOULDER SPECIAL TESTS:            NT due to recent shld arthroscopy biceps tenotomy and SAD   JOINT MOBILITY TESTING:  NT due to recent surgery   PALPATION:  TTP over incision and over Left biceps muscle belly             TODAY'S TREATMENT:   [facility] Adult PT Treatment:                                                DATE: 03-29-22  Therapeutic Exercise: Flexion wall slides 10 x 2  Left shoulder abduction wall slides 10 x 2 -reaches over 130 degrees Standing cane flexion and abduction x 10 each R side lying ER with  2 lb DB and towel and 2 lb weight  3 x 10 3 sec hold R side lying flexion with 2 lb DB  and 2 lb weight  3 x 10 3 sec hold R side lying abduction with 2 lb DB  and  2 lb weight  3 x 10 3 sec hold Red band IR, ER x 10 each  Shoulder row red  1 x 15 Shoulder ext bilat red x 15 Isometrics flexion 5 sec x 10 Supine chest press  Manual Therapy: PROM shoulder flexion, abduction, IR , ER     [facility] Adult PT Treatment:                                                DATE: 03/27/22 Therapeutic Exercise: Flexion wall slides 10 x 2  Left shoulder abduction wall slides 10 x 2 -reaches over 130 degrees Standing cane flexion and abduction x 10 each UBE level 1 x 3 min  forward Pulleys elevation x 2 minutes ~ 130 deg Yellow band IR, ER x 10 each  Shoulder row red  1 x 15 Shoulder ext bilat red x 15 Isometrics flexion 5 sec x 10 Supine chest press Supine pullover with dowel Sidelying ER AROM x15 Sidelying abduction AROM x 6  Clasped hand assisted shoulder PROM for flexion to 140   Manual Therapy:  PROM shoulder flexion, abduction, IR , ER   [facility] Adult PT Treatment:                                                DATE: 03/22/22 Therapeutic Exercise: Flexion wall slides 10 x 2  Left shoulder abduction wall slides 10 x 2  Pulleys elevation x 2 minutes ~ 130 deg Shoulder row red x 15 Isometrics flexion, Er, abct, Ext, IR- updated HEP  Manual Therapy:  PROM shoulder flexion, abduction, IR , ER   [facility] Adult PT Treatment:                                                DATE: 03/19/22 Therapeutic Exercise: Pulleys elevation x 2 minutes ~ 130 deg Shoulder row red x 15 UE ranger AAROM flexion x 10 ~ 130 deg UE ranger AAROM ER x 15 Standing cane (UE ranger) abduction ~45 deg Isometrics flexion, Er, abct, Ext Flexion wall slides 10 x 2   Manual Therapy: STW and IASTM upper arm, bicep Passive flexion, scaption, ER, IR   [facility] Adult PT Treatment:                                                DATE: 03/12/22 Therapeutic Exercise: Standing pendulums 3 way per HEP Isometrics flexion, Er , IR Ext, abduction 5 sec x 10 each  Standing wall slides AAROM Supine chest press   Manual Therapy: Passive ROM flexion, scap, abduct, ER, IR to tolerance- pt has difficulty relaxing  [facility] Adult PT Treatment:                                                DATE: 03/09/22 Therapeutic Exercise: Standing pendulums 3 way  per HEP Standing flexion stretch- hands on high table with step back x 8 Standing scap squeeze  Seated AAROM ER with dowel 10 x 2  Seated abduction slides at table- x 10 Seated flexion slides at table x 10  Supine clasped hand chest press x 10  Manual  Therapy: Passive ROM flexion, scap, abduct, ER, IR to tolerance- pt has difficulty relaxing   [facility] Adult PT Treatment:                                                DATE: 03/05/22 Therapeutic Exercise: Standing pendulums 3 way per HEP Standing flexion stretch- hands on high table with step back x 8 Standing scap squeeze  Seated AAROM ER with dowel 10 x 2  Seated abduction slides at table- x 10 +HEP Seated flexion slides at table x 10  +HEP   Manual Therapy: Passive ROM flexion, scap, abduct, ER, IR to tolerance- pt has difficulty relaxing   [facility] Adult PT Treatment:                                                DATE: 03/05/22 Therapeutic Exercise: Standing pendulums 3 way per HEP Standing flexion stretch- hands on high table with step back x 8 Standing scap squeeze  Seated AAROM ER with dowel 10 x 2  Seated abduction slides at table- x 10 +HEP Seated flexion slides at table x 10  +HEP   Manual Therapy: Passive ROM flexion, scap, abduct, ER, IR to tolerance- pt has difficulty relaxing    [facility] Adult PT Treatment:                                                DATE: 03/01/22 Therapeutic Exercise: Standing pendulums 3 way per HEP Standing flexion stretch- hands on high table with step back x 8 +HEP Standing scap squeeze +HEP Seated PROM abduction- too painful Seated ER iso 5 sec x 5 - manual assist from PTA Seated IR iso  5 sec x 5 - manual assist from PTA  Manual Therapy: Passive ROM flexion, scap, abduct, ER, IR to tolerance- pt has difficulty relaxing     02-27-22 Eval and manual PROM/ issue pendulum exercises     PATIENT EDUCATION: Education details: POC Explanation of findings issue HEP,  Person educated: Patient and Child(ren) Education method: Explanation, Demonstration, Tactile cues, Verbal cues, and Handouts Education comprehension: verbalized understanding, returned demonstration, verbal cues required, tactile cues required, and needs further education      HOME EXERCISE PROGRAM: Access Code: [email or web address] URL: [email or web address] Date: 03/29/2022  updated HEP Prepared by: Garen Lah  Exercises - Circular Shoulder Pendulum with Table Support  - 3 x daily - 7 x weekly - 2 sets - 10 reps - Flexion-Extension Shoulder Pendulum with Table Support  - 3 x daily - 7 x weekly - 2 sets - 10 reps - Horizontal Shoulder Pendulum with Table Support  - 3 x daily - 7 x weekly - 2 sets - 10 reps - Seated Scapular Retraction  - 1 x daily - 7 x weekly - 3  sets - 10 reps - Standing Shoulder and Trunk Flexion at Table  - 1 x daily - 7 x weekly - 1-2 sets - 10 reps - Seated Shoulder External Rotation AAROM with Dowel  - 1 x daily - 7 x weekly - 2 sets - 10 reps - 5 hold - Seated Shoulder Abduction Towel Slide at Table Top with Forearm in Neutral  - 1 x daily - 7 x weekly - 3 sets - 10 reps - Seated Bilateral Shoulder Flexion Towel Slide at Table Top  - 1 x daily - 7 x weekly - 3 sets - 10 reps - Clasped hands chest press and pullover  - 1 x daily - 7 x weekly - 1 sets - 10 reps - Isometric Shoulder Flexion at Wall  - 1 x daily - 7 x weekly - 1-2 sets - 10 reps - 5 hold - Standing Isometric Shoulder Internal Rotation at Doorway  - 1 x daily - 7 x weekly - 1-2 sets - 10 reps - 5 hold - Standing Isometric Shoulder External Rotation with Doorway  - 1 x daily - 7 x weekly - 1-2 sets - 10 reps - 5 hold - Standing Isometric Shoulder Extension with Doorway - Arm Bent  - 1 x daily - 7 x weekly - 1-2 sets - 10 reps - 5 hold - Standing Shoulder Abduction Wall Slide with Thumb Out  - 1 x daily - 7 x weekly - 1-2 sets - 10 reps - Sidelying Shoulder ER with Towel and Dumbbell  - 1 x daily - 7 x weekly - 3 sets - 10 reps - Sidelying Shoulder Flexion 150 degrees  - 1 x daily - 7 x weekly - 3 sets - 10 reps - Sidelying Shoulder Horizontal Abduction  - 1 x daily - 7 x weekly - 3 sets - 10 reps - Sidelying Shoulder Abduction Palm Forward  - 1 x daily - 7 x weekly - 3  sets - 10 reps  Patient Education - Ice - Heat   ASSESSMENT:   CLINICAL IMPRESSION:  Pt enters clinic with 4/10 pain and reports she was able to sleep through the night for 6 hours last night  All STG achieved. Pt HEP updated with added weights to 2 lb in R sidelying position.  Pt with 4/10 pain today and improving slowly with strength.  Will Continue toward completion of goals. Pt with slight increase in pain to 6/10 with exercise and back to 4/10 at end of session.      OBJECTIVE IMPAIRMENTS decreased ROM, decreased strength, impaired UE functional use, postural dysfunction, and pain.    ACTIVITY LIMITATIONS carrying, lifting, sleeping, bathing, toileting, dressing, reach over head, and caring for others not being able to lift 30 # grandson   PARTICIPATION LIMITATIONS:  cannot lift grandson and do housework   PERSONAL FACTORS Asthma, DM, Hyperlipidemia HTN. Mild cognitive impairment  are also affecting patient's functional outcome.    REHAB POTENTIAL: Good   CLINICAL DECISION MAKING: Evolving/moderate complexity   EVALUATION COMPLEXITY: Moderate     GOALS: Goals reviewed with patient? Yes   SHORT TERM GOALS: Target date: 03/27/2022  (Remove Blue Hyperlink)   Pt will be independent with initial HEP Baseline:noknowledge Goal status: MET 03/09/22   2.  Pt will gain PROM in Left shld flexion to at least 140 degrees Baseline: eval 90 degrees PROM Status 03/22/22: 134 flexion PROM Status: 03/27/22 140 flexion PROM Goal status: MET   3.  Pt will  be able to sleep throughout night for at least 4 hours without waking utilizing pillows and positioning for  left shld comform Baseline: wakes at least 3 times a night and cannot sleep on left side 03-29-22  6hours of sleep Status: 03/22/22: wakes 2 x per night due to pain  Goal status: MET       LONG TERM GOALS: Target date: 04/24/2022  (Remove Blue Hyperlink)   Pt will be independent with advanced HEP Baseline: No knowledge 03-29-22  working on advanced HEP Goal status: ONGOING   2.  Pt will be able to hold grandson who is 30 # and carry without exacerbating pain in Left shoulder Baseline: Unable to lift any object or have any resistance at eval Goal status: ONGOING   3.  Pt will be able to sleep on Left side without waking during the night for at least 4-5 hours of uninterrupted sleep. Baseline: Pt wakes at least 3 time a night due to pain in left shld /unable to lie of left shld Status: 03/27/22 :pain wakes 2 x per night Goal status: ONGOING   4.  Pt will be able to perform household chores like sweeping the floor and cooking  with pain 2/10 or less Baseline: unable to perform any household chores Goal status: ONGOING   5.  Pt will be able to perform prayers in childs pose without exacerbating pain in arms Baseline: unable to pray in Muslim prayer posture due to arm pain and decreased AROM Goal status: ONGOING   6.  FOTO will improve from 32%   to  57%     indicating improved functional mobility . Baseline: eval 32% Status: 03/27/22 (8th visit) :  Goal status: ONGOING   7. Pt will be able to lift dishes above head to put in cabinet in order to complete kitchen work.            Baseline:  unable to lift left UE            GOAL status ONGOING     PLAN: PT FREQUENCY: 2x/week   PT DURATION: 8 weeks   PLANNED INTERVENTIONS: Therapeutic exercises, Therapeutic activity, Neuromuscular re-education, Patient/Family education, Joint mobilization, Dry Needling, Electrical stimulation, Cryotherapy, Moist heat, Taping, Ionotophoresis 4mg /ml Dexamethasone, Manual therapy, and Re-evaluation   PLAN FOR NEXT SESSION: continue isometrics and continue AAROM, PROM, repeat yellow band RTC if tolerated   10th progress note next visit   Garen Lah, PT, [facility] Certified Exercise Expert for the Aging Adult  03/29/22 10:59 AM Phone: [phone] Fax: [phone]

## 2022-03-27 NOTE — Therapy (Signed)
OUTPATIENT PHYSICAL THERAPY TREATMENT NOTE   Patient Name: Gabriella Norman MRN: [id number] DOB:[birth year], 66 y.o., female Today's Date: 03/27/2022 PCP: Scheryl Marten, [place]   REFERRING PROVIDER: Aundra Dubin, PA-C  END OF SESSION:   PT End of Session - 03/27/22 1004     Visit Number 8    Number of Visits 16    Date for PT Re-Evaluation 04/24/22    Authorization Type [employer]    PT Start Time 1015    PT Stop Time 1100    PT Time Calculation (min) 45 min             Past Medical History:  Diagnosis Date   Asthma    DM (diabetes mellitus) ([place])    Hyperlipidemia    Hypertension    Past Surgical History:  Procedure Laterality Date   SHOULDER ARTHROSCOPY WITH ROTATOR CUFF REPAIR AND SUBACROMIAL DECOMPRESSION Left 02/09/2022   Procedure: LEFT SHOULDER ARTHROSCOPY, EXTENSIVE DEBRIDEMENT, SUBACROMIAL DECOMPRESSION;  Surgeon: Leandrew Koyanagi, MD;  Location: [facility];  Service: Orthopedics;  Laterality: Left;   TUBAL LIGATION     Patient Active Problem List   Diagnosis Date Noted   Superior labrum anterior-to-posterior (SLAP) tear of right shoulder 09/29/2021   Mild cognitive impairment 07/06/2021   Memory loss 04/03/2021   Pure hypercholesterolemia 04/23/2019   Type 2 diabetes mellitus without complication, without long-term current use of insulin ([place]) 04/23/2019   Benign essential HTN 04/17/2019   Cough variant asthma vs UACS 09/23/2018    REFERRING DIAG: Z98.890 (ICD-10-CM) - S/P arthroscopy of left shoulder   THERAPY DIAG:  Acute pain of left shoulder  Muscle weakness (generalized)  Rationale for Evaluation and Treatment Rehabilitation  PERTINENT HISTORY: Asthma, DM, Hyperlipidemia HTN. Mild cognitive impairment  Presently LEFT SHOULDER ARTHROSCOPY, EXTENSIVE DEBRIDEMENT, SUBACROMIAL DECOMPRESSION;  Superior labrum anterior-to-posterior (SLAP) tear of right shoulder   PRECAUTIONS: Shoulder No lifting, no resistance over biceps  tenotomy PROM initially   SUBJECTIVE: The shoulder feels a little better. I am doing the exercise. I still have pain.   PAIN:  Are you having pain? Yes: NPRS scale: 5/10 Pain location: Left shoulder Pain description: sharp pain Aggravating factors: moving it.cant sleep on my arm, cannot pick up any items with arm Relieving factors: medication, utilizing pillow   OBJECTIVE: (objective measures completed at initial evaluation unless otherwise dated)   DIAGNOSTIC FINDINGS:  FINDINGS: 4-20-22There is no evidence of fracture or dislocation. There is no evidence of arthropathy or other focal bone abnormality. Soft tissues are unremarkable.   IMPRESSION: Negative.   PATIENT SURVEYS:  FOTO 32% eval  57% predicted FOTO status 03/27/22: 52%   COGNITION:           Overall cognitive status: Within functional limits for tasks assessed  mild cognitive impairment in history  with daughter for eval                                  SENSATION: WFL Light touch: Impaired  pt with some numbness into Left forearm    POSTURE: Forward head and rounded shoulders left more forward than Right   UPPER EXTREMITY ROM:    Active/ Passive ROM Right eval Left eval Left 03/01/22 Left 03/05/22 Left  03/09/22 Left 03/12/22 Left 03/22/22 Left 03/27/22  Shoulder flexion 160/P 165 50/P-90 P 100 P 125 A 95 P130 A117/ P134 P 140  Shoulder extension 35  Shoulder abduction 157 50- 80 P 80 P90 A 55 P110 85   Shoulder adduction            Shoulder internal rotation 90 P30        Shoulder external rotation 60 P 0 P45  P60  P80 @ 45    Elbow flexion            Elbow extension            Wrist flexion            Wrist extension            Wrist ulnar deviation            Wrist radial deviation            Wrist pronation            Wrist supination            (Blank rows = not tested)   UPPER EXTREMITY MMT:       Not tested due to post surgery   MMT Right eval Left eval  Shoulder flexion       Shoulder extension      Shoulder abduction      Shoulder adduction      Shoulder internal rotation      Shoulder external rotation      Middle trapezius      Lower trapezius      Elbow flexion      Elbow extension      Wrist flexion      Wrist extension      Wrist ulnar deviation      Wrist radial deviation      Wrist pronation      Wrist supination      Grip strength (lbs)      (Blank rows = not tested)   SHOULDER SPECIAL TESTS:            NT due to recent shld arthroscopy biceps tenotomy and SAD   JOINT MOBILITY TESTING:  NT due to recent surgery   PALPATION:  TTP over incision and over Left biceps muscle belly             TODAY'S TREATMENT:  [facility] Adult PT Treatment:                                                DATE: 03/27/22 Therapeutic Exercise: Flexion wall slides 10 x 2  Left shoulder abduction wall slides 10 x 2 -reaches over 130 degrees Standing cane flexion and abduction x 10 each UBE level 1 x 3 min forward Pulleys elevation x 2 minutes ~ 130 deg Yellow band IR, ER x 10 each  Shoulder row red  1 x 15 Shoulder ext bilat red x 15 Isometrics flexion 5 sec x 10 Supine chest press Supine pullover with dowel Sidelying ER AROM x15 Sidelying abduction AROM x 6  Clasped hand assisted shoulder PROM for flexion to 140   Manual Therapy:  PROM shoulder flexion, abduction, IR , ER   [facility] Adult PT Treatment:                                                  DATE: 03/22/22 Therapeutic Exercise: Flexion wall slides 10 x 2  Left shoulder abduction wall slides 10 x 2  Pulleys elevation x 2 minutes ~ 130 deg Shoulder row red x 15 Isometrics flexion, Er, abct, Ext, IR- updated HEP  Manual Therapy:  PROM shoulder flexion, abduction, IR , ER   [facility] Adult PT Treatment:                                                DATE: 03/19/22 Therapeutic Exercise: Pulleys elevation x 2 minutes ~ 130 deg Shoulder row red x 15 UE ranger AAROM flexion x 10 ~ 130 deg UE ranger AAROM ER x  15 Standing cane (UE ranger) abduction ~45 deg Isometrics flexion, Er, abct, Ext Flexion wall slides 10 x 2   Manual Therapy: STW and IASTM upper arm, bicep Passive flexion, scaption, ER, IR   [facility] Adult PT Treatment:                                                DATE: 03/12/22 Therapeutic Exercise: Standing pendulums 3 way per HEP Isometrics flexion, Er , IR Ext, abduction 5 sec x 10 each  Standing wall slides AAROM Supine chest press   Manual Therapy: Passive ROM flexion, scap, abduct, ER, IR to tolerance- pt has difficulty relaxing  [facility] Adult PT Treatment:                                                DATE: 03/09/22 Therapeutic Exercise: Standing pendulums 3 way per HEP Standing flexion stretch- hands on high table with step back x 8 Standing scap squeeze  Seated AAROM ER with dowel 10 x 2  Seated abduction slides at table- x 10 Seated flexion slides at table x 10  Supine clasped hand chest press x 10  Manual Therapy: Passive ROM flexion, scap, abduct, ER, IR to tolerance- pt has difficulty relaxing   [facility] Adult PT Treatment:                                                DATE: 03/05/22 Therapeutic Exercise: Standing pendulums 3 way per HEP Standing flexion stretch- hands on high table with step back x 8 Standing scap squeeze  Seated AAROM ER with dowel 10 x 2  Seated abduction slides at table- x 10 +HEP Seated flexion slides at table x 10  +HEP   Manual Therapy: Passive ROM flexion, scap, abduct, ER, IR to tolerance- pt has difficulty relaxing   [facility] Adult PT Treatment:                                                DATE: 03/05/22 Therapeutic Exercise: Standing pendulums 3 way per HEP Standing flexion stretch- hands on high table with step back x 8 Standing scap squeeze  Seated AAROM ER   with dowel 10 x 2  Seated abduction slides at table- x 10 +HEP Seated flexion slides at table x 10  +HEP   Manual Therapy: Passive ROM flexion, scap, abduct, ER, IR to  tolerance- pt has difficulty relaxing    [facility] Adult PT Treatment:                                                DATE: 03/01/22 Therapeutic Exercise: Standing pendulums 3 way per HEP Standing flexion stretch- hands on high table with step back x 8 +HEP Standing scap squeeze +HEP Seated PROM abduction- too painful Seated ER iso 5 sec x 5 - manual assist from PTA Seated IR iso  5 sec x 5 - manual assist from PTA  Manual Therapy: Passive ROM flexion, scap, abduct, ER, IR to tolerance- pt has difficulty relaxing     02-27-22 Eval and manual PROM/ issue pendulum exercises     PATIENT EDUCATION: Education details: POC Explanation of findings issue HEP,  Person educated: Patient and Child(ren) Education method: Explanation, Demonstration, Tactile cues, Verbal cues, and Handouts Education comprehension: verbalized understanding, returned demonstration, verbal cues required, tactile cues required, and needs further education     HOME EXERCISE PROGRAM: Access Code: [email or web address] URL: [email or web address] Date: 03/22/2022 Prepared by:    Exercises - Circular Shoulder Pendulum with Table Support  - 3 x daily - 7 x weekly - 2 sets - 10 reps - Flexion-Extension Shoulder Pendulum with Table Support  - 3 x daily - 7 x weekly - 2 sets - 10 reps - Horizontal Shoulder Pendulum with Table Support  - 3 x daily - 7 x weekly - 2 sets - 10 reps - Seated Scapular Retraction  - 1 x daily - 7 x weekly - 3 sets - 10 reps - Standing Shoulder and Trunk Flexion at Table  - 1 x daily - 7 x weekly - 1-2 sets - 10 reps - Seated Shoulder External Rotation AAROM with Dowel  - 1 x daily - 7 x weekly - 2 sets - 10 reps - 5 hold - Seated Shoulder Abduction Towel Slide at Table Top with Forearm in Neutral  - 1 x daily - 7 x weekly - 3 sets - 10 reps - Seated Bilateral Shoulder Flexion Towel Slide at Table Top  - 1 x daily - 7 x weekly - 3 sets - 10 reps - Clasped hands chest press and pullover   - 1 x daily - 7 x weekly - 1 sets - 10 reps - Isometric Shoulder Flexion at Wall  - 1 x daily - 7 x weekly - 1-2 sets - 10 reps - 5 hold - Standing Isometric Shoulder Internal Rotation at Doorway  - 1 x daily - 7 x weekly - 1-2 sets - 10 reps - 5 hold - Standing Isometric Shoulder External Rotation with Doorway  - 1 x daily - 7 x weekly - 1-2 sets - 10 reps - 5 hold - Standing Isometric Shoulder Extension with Doorway - Arm Bent  - 1 x daily - 7 x weekly - 1-2 sets - 10 reps - 5 hold - Standing Shoulder Abduction Wall Slide with Thumb Out  - 1 x daily - 7 x weekly - 1-2 sets - 10 reps  Patient Education - Ice - Heat   ASSESSMENT:   CLINICAL IMPRESSION: Patient is   a 66  y.o. female who was seen today for physical therapy treatment for -post-op left shoulder arthroscopic debridement, biceps tenotomy and subacromial decompression. She is 6 weeks post-op. AROM still limited and painful, especially with eccentrics. Continued with AAROM and light strengthening which she tolerates well. Initiated forward UBE with good tolerance. She has reached 140 flexion PROM and FOTO score improved. STG# 2 met.  Pt will benefit from skilled PT to address impairment and rehab in order to return to functional use of arm in order to return to her routine of praying in prayer position and carrying her grandson. STG# 1 met and making progress toward remaining STGs.      OBJECTIVE IMPAIRMENTS decreased ROM, decreased strength, impaired UE functional use, postural dysfunction, and pain.    ACTIVITY LIMITATIONS carrying, lifting, sleeping, bathing, toileting, dressing, reach over head, and caring for others not being able to lift 30 # grandson   PARTICIPATION LIMITATIONS:  cannot lift grandson and do housework   PERSONAL FACTORS Asthma, DM, Hyperlipidemia HTN. Mild cognitive impairment  are also affecting patient's functional outcome.    REHAB POTENTIAL: Good   CLINICAL DECISION MAKING: Evolving/moderate complexity    EVALUATION COMPLEXITY: Moderate     GOALS: Goals reviewed with patient? Yes   SHORT TERM GOALS: Target date: 03/27/2022  (Remove Blue Hyperlink)   Pt will be independent with initial HEP Baseline:noknowledge Goal status: MET 03/09/22   2.  Pt will gain PROM in Left shld flexion to at least 140 degrees Baseline: eval 90 degrees PROM Status 03/22/22: 134 flexion PROM Status: 03/27/22 140 flexion PROM Goal status: MET   3.  Pt will be able to sleep throughout night for at least 4 hours without waking utilizing pillows and positioning for  left shld comform Baseline: wakes at least 3 times a night and cannot sleep on left side Status: 03/22/22: wakes 2 x per night due to pain  Goal status: ONGOING       LONG TERM GOALS: Target date: 04/24/2022  (Remove Blue Hyperlink)   Pt will be independent with advanced HEP Baseline: No knowledge Goal status: ONGOING   2.  Pt will be able to hold grandson who is 30 # and carry without exacerbating pain in Left shoulder Baseline: Unable to lift any object or have any resistance at eval Goal status: ONGOING   3.  Pt will be able to sleep on Left side without waking during the night for at least 4-5 hours of uninterrupted sleep. Baseline: Pt wakes at least 3 time a night due to pain in left shld /unable to lie of left shld Status: 03/27/22 :pain wakes 2 x per night Goal status: ONGOING   4.  Pt will be able to perform household chores like sweeping the floor and cooking  with pain 2/10 or less Baseline: unable to perform any household chores Goal status: ONGOING   5.  Pt will be able to perform prayers in childs pose without exacerbating pain in arms Baseline: unable to pray in Muslim prayer posture due to arm pain and decreased AROM Goal status: ONGOING   6.  FOTO will improve from 32%   to  57%     indicating improved functional mobility . Baseline: eval 32% Status: 03/27/22 (8th visit) :  Goal status: ONGOING   7. Pt will be able to lift  dishes above head to put in cabinet in order to complete kitchen work.            Baseline:    unable to lift left UE            GOAL status ONGOING     PLAN: PT FREQUENCY: 2x/week   PT DURATION: 8 weeks   PLANNED INTERVENTIONS: Therapeutic exercises, Therapeutic activity, Neuromuscular re-education, Patient/Family education, Joint mobilization, Dry Needling, Electrical stimulation, Cryotherapy, Moist heat, Taping, Ionotophoresis 13m/ml Dexamethasone, Manual therapy, and Re-evaluation   PLAN FOR NEXT SESSION: continue isometrics and continue AAROM, PROM, repeat yellow band RTC if tolerated   JHessie Diener PTA 03/27/22 11:11 AM Phone: [phone]: [phone]

## 2022-03-29 ENCOUNTER — Ambulatory Visit: Payer: 59 | Admitting: Physical Therapy

## 2022-03-29 DIAGNOSIS — M6281 Muscle weakness (generalized): Secondary | ICD-10-CM

## 2022-03-29 DIAGNOSIS — M25512 Pain in left shoulder: Secondary | ICD-10-CM

## 2022-03-29 NOTE — Patient Instructions (Addendum)
      Garen Lah, PT, ATRIC Certified Exercise Expert for the Aging Adult  03/29/22 10:50 AM Phone: [phone] Fax: [phone]

## 2022-03-29 NOTE — Therapy (Signed)
OUTPATIENT PHYSICAL THERAPY TREATMENT NOTE   Patient Name: Gabriella Norman MRN: [id number] DOB:05-May-1956, 66 y.o., female Today's Date: 04/03/2022 PCP: Scheryl Marten, [place]   REFERRING PROVIDER: Aundra Dubin, PA-C  END OF SESSION:   PT End of Session - 04/03/22 0934     Visit Number 10    Number of Visits 16    Date for PT Re-Evaluation 04/24/22    Authorization Type [employer]    PT Start Time [phone]    PT Stop Time 1015    PT Time Calculation (min) 40 min    Activity Tolerance Patient tolerated treatment well;Patient limited by pain    Behavior During Therapy [facility] for tasks assessed/performed               Past Medical History:  Diagnosis Date   Asthma    DM (diabetes mellitus) ([place])    Hyperlipidemia    Hypertension    Past Surgical History:  Procedure Laterality Date   SHOULDER ARTHROSCOPY WITH ROTATOR CUFF REPAIR AND SUBACROMIAL DECOMPRESSION Left 02/09/2022   Procedure: LEFT SHOULDER ARTHROSCOPY, EXTENSIVE DEBRIDEMENT, SUBACROMIAL DECOMPRESSION;  Surgeon: Leandrew Koyanagi, MD;  Location: [place];  Service: Orthopedics;  Laterality: Left;   TUBAL LIGATION     Patient Active Problem List   Diagnosis Date Noted   Superior labrum anterior-to-posterior (SLAP) tear of right shoulder 09/29/2021   Mild cognitive impairment 07/06/2021   Memory loss 04/03/2021   Pure hypercholesterolemia 04/23/2019   Type 2 diabetes mellitus without complication, without long-term current use of insulin ([place]) 04/23/2019   Benign essential HTN 04/17/2019   Cough variant asthma vs UACS 09/23/2018    REFERRING DIAG: Z98.890 (ICD-10-CM) - S/P arthroscopy of left shoulder   THERAPY DIAG:  Acute pain of left shoulder  Muscle weakness (generalized)  Rationale for Evaluation and Treatment Rehabilitation  PERTINENT HISTORY: Asthma, DM, Hyperlipidemia HTN. Mild cognitive impairment  Presently LEFT SHOULDER ARTHROSCOPY, EXTENSIVE DEBRIDEMENT, SUBACROMIAL  DECOMPRESSION;  Superior labrum anterior-to-posterior (SLAP) tear of right shoulder   PRECAUTIONS: Shoulder No lifting, no resistance over biceps tenotomy PROM initially   SUBJECTIVE: The shoulder feels a little better.  I like the sidelying exercises. I am a 5/10 today  PAIN:  Are you having pain? Yes: NPRS scale: 5/10 Pain location: Left shoulder Pain description: sharp pain Aggravating factors: moving it.cant sleep on my arm, cannot pick up any items with arm Relieving factors: medication, utilizing pillow   OBJECTIVE: (objective measures completed at initial evaluation unless otherwise dated)   DIAGNOSTIC FINDINGS:  FINDINGS: 4-20-22There is no evidence of fracture or dislocation. There is no evidence of arthropathy or other focal bone abnormality. Soft tissues are unremarkable.   IMPRESSION: Negative.   PATIENT SURVEYS:  FOTO 32% eval  57% predicted FOTO status 03/27/22: 52% FOTO status 04-03-22 54%   COGNITION:           Overall cognitive status: Within functional limits for tasks assessed  mild cognitive impairment in history  with daughter for eval                                  SENSATION: WFL Light touch: Impaired  pt with some numbness into Left forearm    POSTURE: Forward head and rounded shoulders left more forward than Right   UPPER EXTREMITY ROM:    Active/ Passive ROM Right eval Left eval Left 03/01/22 Left 03/05/22 Left  03/09/22 Left 03/12/22  Left 03/22/22 Left 03/27/22 Left 03-27-22  Shoulder flexion 160/P 165 50/P-90 P 100 P 125 A 95 P130 A117/ P134 P 140 P143 Pain  Shoulder extension 35           Shoulder abduction 157 50- 80 P 80 P90 A 55 P110 85  85 Pain  Shoulder adduction             Shoulder internal rotation 90 P30       To L1 with pain  Shoulder external rotation 60 P 0 P45  P60  P80 @ 45     Elbow flexion             Elbow extension             Wrist flexion             Wrist extension             Wrist ulnar deviation              Wrist radial deviation             Wrist pronation             Wrist supination             (Blank rows = not tested)   UPPER EXTREMITY MMT:       Not tested due to post surgery Pt 5/5 on R   MMT Right eval Left eval Left 03-29-22  Shoulder flexion     4-  Shoulder extension     4-  Shoulder abduction     4-  Shoulder adduction       Shoulder internal rotation     3+  Shoulder external rotation     4-  Middle trapezius       Lower trapezius       Elbow flexion       Elbow extension       Wrist flexion       Wrist extension       Wrist ulnar deviation       Wrist radial deviation       Wrist pronation       Wrist supination       Grip strength (lbs)       (Blank rows = not tested)   SHOULDER SPECIAL TESTS:            NT due to recent shld arthroscopy biceps tenotomy and SAD   JOINT MOBILITY TESTING:  NT due to recent surgery   PALPATION:  TTP over incision and over Left biceps muscle belly             TODAY'S TREATMENT:  [facility] Adult PT Treatment:                                                DATE: 03-29-22 FOTO 10th visit 54% predicted 57% Therapeutic Exercise: UBE 2.30 min forward and then 2.30 min backward. VC for posture and chin tuck R side lying ER with  2 lb DB and towel and 2 lb weight  3 x 10 3 sec hold R side lying flexion with 2 lb DB  and 2 lb weight  3 x 10 3 sec hold R side lying abduction with 2 lb DB  and 2 lb weight  3 x 10 3  sec hold L sidelying with RTB eccentric ER  with shld 90 degree and Video on pt phone for use at home 3 x 10 with PT assist Horizontal abd RTB 3 x 10 in supine with hooklying.  VC for pt to slow down and 3 sec hold Diagonals  RTB 3 x 10 in supine with hooklying.  VC for pt to slow down and 3 sec hold Sitting with single Left OH press with 2 lb 2 x 10 Manual Therapy: PROM shoulder flexion, abduction, IR , ER  Isometric at various angles for shld abd/ flexion   [facility] Adult PT Treatment:                                                 DATE: 03-29-22  Therapeutic Exercise: Flexion wall slides 10 x 2  Left shoulder abduction wall slides 10 x 2 -reaches over 130 degrees Standing cane flexion and abduction x 10 each R side lying ER with  2 lb DB and towel and 2 lb weight  3 x 10 3 sec hold R side lying flexion with 2 lb DB  and 2 lb weight  3 x 10 3 sec hold R side lying abduction with 2 lb DB  and 2 lb weight  3 x 10 3 sec hold Red band IR, ER x 10 each  Shoulder row red  1 x 15 Shoulder ext bilat red x 15 Isometrics flexion 5 sec x 10 Supine chest press  Manual Therapy: PROM shoulder flexion, abduction, IR , ER     [facility] Adult PT Treatment:                                                DATE: 03/27/22 Therapeutic Exercise: Flexion wall slides 10 x 2  Left shoulder abduction wall slides 10 x 2 -reaches over 130 degrees Standing cane flexion and abduction x 10 each UBE level 1 x 3 min forward Pulleys elevation x 2 minutes ~ 130 deg Yellow band IR, ER x 10 each  Shoulder row red  1 x 15 Shoulder ext bilat red x 15 Isometrics flexion 5 sec x 10 Supine chest press Supine pullover with dowel Sidelying ER AROM x15 Sidelying abduction AROM x 6  Clasped hand assisted shoulder PROM for flexion to 140   Manual Therapy:  PROM shoulder flexion, abduction, IR , ER   [facility] Adult PT Treatment:                                                DATE: 03/22/22 Therapeutic Exercise: Flexion wall slides 10 x 2  Left shoulder abduction wall slides 10 x 2  Pulleys elevation x 2 minutes ~ 130 deg Shoulder row red x 15 Isometrics flexion, Er, abct, Ext, IR- updated HEP  Manual Therapy:  PROM shoulder flexion, abduction, IR , ER   [facility] Adult PT Treatment:  DATE: 03/19/22 Therapeutic Exercise: Pulleys elevation x 2 minutes ~ 130 deg Shoulder row red x 15 UE ranger AAROM flexion x 10 ~ 130 deg UE ranger AAROM ER x 15 Standing cane (UE ranger) abduction ~45 deg Isometrics flexion,  Er, abct, Ext Flexion wall slides 10 x 2   Manual Therapy: STW and IASTM upper arm, bicep Passive flexion, scaption, ER, IR   [facility] Adult PT Treatment:                                                DATE: 03/12/22 Therapeutic Exercise: Standing pendulums 3 way per HEP Isometrics flexion, Er , IR Ext, abduction 5 sec x 10 each  Standing wall slides AAROM Supine chest press   Manual Therapy: Passive ROM flexion, scap, abduct, ER, IR to tolerance- pt has difficulty relaxing  [facility] Adult PT Treatment:                                                DATE: 03/09/22 Therapeutic Exercise: Standing pendulums 3 way per HEP Standing flexion stretch- hands on high table with step back x 8 Standing scap squeeze  Seated AAROM ER with dowel 10 x 2  Seated abduction slides at table- x 10 Seated flexion slides at table x 10  Supine clasped hand chest press x 10  Manual Therapy: Passive ROM flexion, scap, abduct, ER, IR to tolerance- pt has difficulty relaxing   [facility] Adult PT Treatment:                                                DATE: 03/05/22 Therapeutic Exercise: Standing pendulums 3 way per HEP Standing flexion stretch- hands on high table with step back x 8 Standing scap squeeze  Seated AAROM ER with dowel 10 x 2  Seated abduction slides at table- x 10 +HEP Seated flexion slides at table x 10  +HEP   Manual Therapy: Passive ROM flexion, scap, abduct, ER, IR to tolerance- pt has difficulty relaxing   [facility] Adult PT Treatment:                                                DATE: 03/05/22 Therapeutic Exercise: Standing pendulums 3 way per HEP Standing flexion stretch- hands on high table with step back x 8 Standing scap squeeze  Seated AAROM ER with dowel 10 x 2  Seated abduction slides at table- x 10 +HEP Seated flexion slides at table x 10  +HEP   Manual Therapy: Passive ROM flexion, scap, abduct, ER, IR to tolerance- pt has difficulty relaxing    [facility] Adult PT Treatment:                                                 DATE: 03/01/22 Therapeutic Exercise: Standing pendulums 3 way per HEP  Standing flexion stretch- hands on high table with step back x 8 +HEP Standing scap squeeze +HEP Seated PROM abduction- too painful Seated ER iso 5 sec x 5 - manual assist from PTA Seated IR iso  5 sec x 5 - manual assist from PTA  Manual Therapy: Passive ROM flexion, scap, abduct, ER, IR to tolerance- pt has difficulty relaxing     02-27-22 Eval and manual PROM/ issue pendulum exercises     PATIENT EDUCATION: Education details: POC Explanation of findings issue HEP,  Person educated: Patient and Child(ren) Education method: Explanation, Demonstration, Tactile cues, Verbal cues, and Handouts Education comprehension: verbalized understanding, returned demonstration, verbal cues required, tactile cues required, and needs further education     HOME EXERCISE PROGRAM: Access Code: [email or web address] URL: [email or web address] Date: 03/29/2022  updated HEP Prepared by: Voncille Lo  Exercises - Circular Shoulder Pendulum with Table Support  - 3 x daily - 7 x weekly - 2 sets - 10 reps - Flexion-Extension Shoulder Pendulum with Table Support  - 3 x daily - 7 x weekly - 2 sets - 10 reps - Horizontal Shoulder Pendulum with Table Support  - 3 x daily - 7 x weekly - 2 sets - 10 reps - Seated Scapular Retraction  - 1 x daily - 7 x weekly - 3 sets - 10 reps - Standing Shoulder and Trunk Flexion at Table  - 1 x daily - 7 x weekly - 1-2 sets - 10 reps - Seated Shoulder External Rotation AAROM with Dowel  - 1 x daily - 7 x weekly - 2 sets - 10 reps - 5 hold - Seated Shoulder Abduction Towel Slide at Table Top with Forearm in Neutral  - 1 x daily - 7 x weekly - 3 sets - 10 reps - Seated Bilateral Shoulder Flexion Towel Slide at Table Top  - 1 x daily - 7 x weekly - 3 sets - 10 reps - Clasped hands chest press and pullover  - 1 x daily - 7 x weekly - 1 sets - 10 reps - Isometric  Shoulder Flexion at Wall  - 1 x daily - 7 x weekly - 1-2 sets - 10 reps - 5 hold - Standing Isometric Shoulder Internal Rotation at Doorway  - 1 x daily - 7 x weekly - 1-2 sets - 10 reps - 5 hold - Standing Isometric Shoulder External Rotation with Doorway  - 1 x daily - 7 x weekly - 1-2 sets - 10 reps - 5 hold - Standing Isometric Shoulder Extension with Doorway - Arm Bent  - 1 x daily - 7 x weekly - 1-2 sets - 10 reps - 5 hold - Standing Shoulder Abduction Wall Slide with Thumb Out  - 1 x daily - 7 x weekly - 1-2 sets - 10 reps - Sidelying Shoulder ER with Towel and Dumbbell  - 1 x daily - 7 x weekly - 3 sets - 10 reps - Sidelying Shoulder Flexion 150 degrees  - 1 x daily - 7 x weekly - 3 sets - 10 reps - Sidelying Shoulder Horizontal Abduction  - 1 x daily - 7 x weekly - 3 sets - 10 reps - Sidelying Shoulder Abduction Palm Forward  - 1 x daily - 7 x weekly - 3 sets - 10 reps  Patient Education - Ice - Heat   ASSESSMENT:   CLINICAL IMPRESSION:  Pt enters clinic with 5/10 pain.  Pt reports that she is not able to sleep on R  shld without waking up at night. Pt does like the sidelying exercises given and reports that she is able to do with a soup can at home.   At clinic , pt utlizing 2 lb DB. FOTO improved to 54%  predicted 57%Will Continue toward completion of goals. Pt with slight increase in pain to 6/10 with exercise initially. and back to 3/10 at end of session. Pt does fatigue with exercise but she has better motion post RX      OBJECTIVE IMPAIRMENTS decreased ROM, decreased strength, impaired UE functional use, postural dysfunction, and pain.    ACTIVITY LIMITATIONS carrying, lifting, sleeping, bathing, toileting, dressing, reach over head, and caring for others not being able to lift 30 # grandson   PARTICIPATION LIMITATIONS:  cannot lift grandson and do housework   PERSONAL FACTORS Asthma, DM, Hyperlipidemia HTN. Mild cognitive impairment  are also affecting patient's functional  outcome.    REHAB POTENTIAL: Good   CLINICAL DECISION MAKING: Evolving/moderate complexity   EVALUATION COMPLEXITY: Moderate     GOALS: Goals reviewed with patient? Yes   SHORT TERM GOALS: Target date: 03/27/2022  (Remove Blue Hyperlink)   Pt will be independent with initial HEP Baseline:noknowledge Goal status: MET 03/09/22   2.  Pt will gain PROM in Left shld flexion to at least 140 degrees Baseline: eval 90 degrees PROM Status 03/22/22: 134 flexion PROM Status: 03/27/22 140 flexion PROM Goal status: MET   3.  Pt will be able to sleep throughout night for at least 4 hours without waking utilizing pillows and positioning for  left shld comform Baseline: wakes at least 3 times a night and cannot sleep on left side 03-29-22  6hours of sleep Status: 03/22/22: wakes 2 x per night due to pain  Goal status: MET       LONG TERM GOALS: Target date: 04/24/2022  (Remove Blue Hyperlink)   Pt will be independent with advanced HEP Baseline: No knowledge 03-29-22 working on advanced HEP Goal status: ONGOING   2.  Pt will be able to hold grandson who is 30 # and carry without exacerbating pain in Left shoulder Baseline: Unable to lift any object or have any resistance at eval Goal status: ONGOING   3.  Pt will be able to sleep on Left side without waking during the night for at least 4-5 hours of uninterrupted sleep. Baseline: Pt wakes at least 3 time a night due to pain in left shld /unable to lie of left shld Status: 03/27/22 :pain wakes 2 x per night Goal status: ONGOING   4.  Pt will be able to perform household chores like sweeping the floor and cooking  with pain 2/10 or less Baseline: unable to perform any household chores Goal status: ONGOING   5.  Pt will be able to perform prayers in childs pose without exacerbating pain in arms Baseline: unable to pray in Muslim prayer posture due to arm pain and decreased AROM Goal status: ONGOING   6.  FOTO will improve from 32%   to  57%      indicating improved functional mobility . Baseline: eval 32% Status: 03/27/22 (8th visit) :  Goal status: ONGOING   7. Pt will be able to lift dishes above head to put in cabinet in order to complete kitchen work.            Baseline:  unable to lift left UE            GOAL status ONGOING  PLAN: PT FREQUENCY: 2x/week   PT DURATION: 8 weeks   PLANNED INTERVENTIONS: Therapeutic exercises, Therapeutic activity, Neuromuscular re-education, Patient/Family education, Joint mobilization, Dry Needling, Electrical stimulation, Cryotherapy, Moist heat, Taping, Ionotophoresis 5m/ml Dexamethasone, Manual therapy, and Re-evaluation   PLAN FOR NEXT SESSION: continue isometrics and continue AAROM, PROM, repeat yellow band RTC if tolerated   10th progress note next visit   LVoncille Lo PT, AHoly Family Memorial IncCertified Exercise Expert for the Aging Adult  04/03/22 2:29 PM Phone: [phone]: [phone]

## 2022-04-03 ENCOUNTER — Encounter: Payer: Self-pay | Admitting: Physical Therapy

## 2022-04-03 ENCOUNTER — Ambulatory Visit: Payer: 59 | Admitting: Physical Therapy

## 2022-04-03 DIAGNOSIS — M25512 Pain in left shoulder: Secondary | ICD-10-CM | POA: Diagnosis not present

## 2022-04-03 DIAGNOSIS — M6281 Muscle weakness (generalized): Secondary | ICD-10-CM

## 2022-04-05 ENCOUNTER — Ambulatory Visit: Payer: 59 | Admitting: Physical Therapy

## 2022-04-05 NOTE — Therapy (Signed)
OUTPATIENT PHYSICAL THERAPY TREATMENT NOTE   Patient Name: Gabriella Norman MRN: [id number] DOB:[birth year], 66 y.o., female Today's Date: 04/10/2022 PCP: Scheryl Marten, [place]   REFERRING PROVIDER: Aundra Dubin, PA-C  END OF SESSION:   PT End of Session - 04/10/22 0845     Visit Number 11    Number of Visits 16    Date for PT Re-Evaluation 04/24/22    Authorization Type [employer]    PT Start Time 0805    PT Stop Time 0900    PT Time Calculation (min) 55 min    Activity Tolerance Patient tolerated treatment well    Behavior During Therapy WFL for tasks assessed/performed                Past Medical History:  Diagnosis Date   Asthma    DM (diabetes mellitus) ([place])    Hyperlipidemia    Hypertension    Past Surgical History:  Procedure Laterality Date   SHOULDER ARTHROSCOPY WITH ROTATOR CUFF REPAIR AND SUBACROMIAL DECOMPRESSION Left 02/09/2022   Procedure: LEFT SHOULDER ARTHROSCOPY, EXTENSIVE DEBRIDEMENT, SUBACROMIAL DECOMPRESSION;  Surgeon: Leandrew Koyanagi, MD;  Location: [facility];  Service: Orthopedics;  Laterality: Left;   TUBAL LIGATION     Patient Active Problem List   Diagnosis Date Noted   Superior labrum anterior-to-posterior (SLAP) tear of right shoulder 09/29/2021   Mild cognitive impairment 07/06/2021   Memory loss 04/03/2021   Pure hypercholesterolemia 04/23/2019   Type 2 diabetes mellitus without complication, without long-term current use of insulin ([place]) 04/23/2019   Benign essential HTN 04/17/2019   Cough variant asthma vs UACS 09/23/2018    REFERRING DIAG: Z98.890 (ICD-10-CM) - S/P arthroscopy of left shoulder   THERAPY DIAG:  Acute pain of left shoulder  Muscle weakness (generalized)  Rationale for Evaluation and Treatment Rehabilitation  PERTINENT HISTORY: Asthma, DM, Hyperlipidemia HTN. Mild cognitive impairment  Presently LEFT SHOULDER ARTHROSCOPY, EXTENSIVE DEBRIDEMENT, SUBACROMIAL DECOMPRESSION;  Superior  labrum anterior-to-posterior (SLAP) tear of right shoulder   PRECAUTIONS: Shoulder No lifting, no resistance over biceps tenotomy PROM initially   SUBJECTIVE: I am a 5/10 today. I have trouble lying on my R side.    The pain wakes me up  PAIN:  Are you having pain? Yes: NPRS scale: 5/10 Pain location: Left shoulder Pain description: sharp pain Aggravating factors: moving it.cant sleep on my arm, cannot pick up any items with arm Relieving factors: medication, utilizing pillow   OBJECTIVE: (objective measures completed at initial evaluation unless otherwise dated)   DIAGNOSTIC FINDINGS:  FINDINGS: 4-20-22There is no evidence of fracture or dislocation. There is no evidence of arthropathy or other focal bone abnormality. Soft tissues are unremarkable.   IMPRESSION: Negative.   PATIENT SURVEYS:  FOTO 32% eval  57% predicted FOTO status 03/27/22: 52% FOTO status 04-03-22 54%   COGNITION:           Overall cognitive status: Within functional limits for tasks assessed  mild cognitive impairment in history  with daughter for eval                                  SENSATION: WFL Light touch: Impaired  pt with some numbness into Left forearm    POSTURE: Forward head and rounded shoulders left more forward than Right   UPPER EXTREMITY ROM:    Active/ Passive ROM Right eval Left eval Left 03/01/22 Left 03/05/22 Left  03/09/22  Left 03/12/22 Left 03/22/22 Left 03/27/22 Left 03-27-22  Shoulder flexion 160/P 165 50/P-90 P 100 P 125 A 95 P130 A117/ P134 P 140 P143 Pain  Shoulder extension 35           Shoulder abduction 157 50- 80 P 80 P90 A 55 P110 85  85 Pain  Shoulder adduction             Shoulder internal rotation 90 P30       To L1 with pain  Shoulder external rotation 60 P 0 P45  P60  P80 @ 45     Elbow flexion             Elbow extension             Wrist flexion             Wrist extension             Wrist ulnar deviation             Wrist radial deviation              Wrist pronation             Wrist supination             (Blank rows = not tested)   UPPER EXTREMITY MMT:       Not tested due to post surgery Pt 5/5 on R   MMT Right eval Left eval Left 03-29-22  Shoulder flexion     4-  Shoulder extension     4-  Shoulder abduction     4-  Shoulder adduction       Shoulder internal rotation     3+  Shoulder external rotation     4-  Middle trapezius       Lower trapezius       Elbow flexion       Elbow extension       Wrist flexion       Wrist extension       Wrist ulnar deviation       Wrist radial deviation       Wrist pronation       Wrist supination       Grip strength (lbs)       (Blank rows = not tested)   SHOULDER SPECIAL TESTS:            NT due to recent shld arthroscopy biceps tenotomy and SAD   JOINT MOBILITY TESTING:  NT due to recent surgery   PALPATION:  TTP over incision and over Left biceps muscle belly             TODAY'S TREATMENT:   [facility] Adult PT Treatment:                                                DATE: 04-10-22 Therapeutic Exercise: R side lying ER  L with  2 lb DB and towel and 3 lb weight  3 x 10 3 sec hold R side lying flexion  L with 2 lb DB  and 3 lb weight  3 x 10 3 sec hold R side lying abduction  L with 2 lb DB  and 3 lb weight  3 x 10 3 sec hold BIL OH press with 15 #  2 x 10  Horizontal Abd 3 x 10 RTB Diagonals 3 x 10 RTB Needed TC and VC to perform correctly Left Bicept 5 lb  3 x 10 L sidelying with RTB eccentric ER  with shld 90 degree and Video on pt phone for use at home 3 x 10 with PT assist BIL OH press with 20 # with PT assist. Pt very tentative but then able to perform 2 x 5 with rest 1/2 range just above head.  Manual Therapy: STW of left deltoid and left biceps PROM shoulder flexion, abduction, IR , ER  Isometric at various angles for shld abd/ flexion Trigger Point Dry Needling Treatment: Pre-treatment instruction: Patient instructed on dry needling rationale, procedures, and  possible side effects including pain during treatment (achy,cramping feeling), bruising, drop of blood, lightheadedness, nausea, sweating. Patient Consent Given: Yes Education handout provided: Yes Muscles treated: Left deltoid ant, middle  Needle size and number: .30x9m x 1  to use in multiple places over left deltoid  ant/middle Electrical stimulation performed: No Parameters: N/A Treatment response/outcome: Twitch response elicited and Palpable decrease in muscle tension Post-treatment instructions: Patient instructed to expect possible mild to moderate muscle soreness later today and/or tomorrow. Patient instructed in methods to reduce muscle soreness and to continue prescribed HEP. If patient was dry needled over the lung field, patient was instructed on signs and symptoms of pneumothorax and, however unlikely, to see immediate medical attention should they occur. Patient was also educated on signs and symptoms of infection and to seek medical attention should they occur. Patient verbalized understanding of these instructions and education.  Modalities - Moist hot pack to left deltoid   [facility]                                                DATE: 03-29-22 FOTO 10th visit 54% predicted 57% Therapeutic Exercise: UBE 2.30 min forward and then 2.30 min backward. VC for posture and chin tuck R side lying ER L with  2 lb DB and towel and 2 lb weight  3 x 10 3 sec hold R side lying flexion  L with 2 lb DB  and 2 lb weight  3 x 10 3 sec hold R side lying abduction L with 2 lb DB  and 2 lb weight  3 x 10 3 sec hold L sidelying with RTB eccentric ER  with shld 90 degree and Video on pt phone for use at home 3 x 10 with PT assist Horizontal abd RTB 3 x 10 in supine with hooklying.  VC for pt to slow down and 3 sec hold Diagonals  RTB 3 x 10 in supine with hooklying.  VC for pt to slow down and 3 sec hold Sitting with single Left OH press with 2 lb 2 x 10 Manual Therapy: PROM  shoulder flexion, abduction, IR , ER  Isometric at various angles for shld abd/ flexion   [facility] Adult PT Treatment:                                                DATE: 03-29-22  Therapeutic Exercise: Flexion wall slides 10 x 2  Left shoulder abduction wall slides 10 x 2 -reaches over 130 degrees  Standing cane flexion and abduction x 10 each R side lying ER with  2 lb DB and towel and 2 lb weight  3 x 10 3 sec hold R side lying flexion with 2 lb DB  and 2 lb weight  3 x 10 3 sec hold R side lying abduction with 2 lb DB  and 2 lb weight  3 x 10 3 sec hold Red band IR, ER x 10 each  Shoulder row red  1 x 15 Shoulder ext bilat red x 15 Isometrics flexion 5 sec x 10 Supine chest press  Manual Therapy: PROM shoulder flexion, abduction, IR , ER     [facility] Adult PT Treatment:                                                DATE: 03/27/22 Therapeutic Exercise: Flexion wall slides 10 x 2  Left shoulder abduction wall slides 10 x 2 -reaches over 130 degrees Standing cane flexion and abduction x 10 each UBE level 1 x 3 min forward Pulleys elevation x 2 minutes ~ 130 deg Yellow band IR, ER x 10 each  Shoulder row red  1 x 15 Shoulder ext bilat red x 15 Isometrics flexion 5 sec x 10 Supine chest press Supine pullover with dowel Sidelying ER AROM x15 Sidelying abduction AROM x 6  Clasped hand assisted shoulder PROM for flexion to 140   Manual Therapy:  PROM shoulder flexion, abduction, IR , ER   [facility] Adult PT Treatment:                                                DATE: 03/22/22 Therapeutic Exercise: Flexion wall slides 10 x 2  Left shoulder abduction wall slides 10 x 2  Pulleys elevation x 2 minutes ~ 130 deg Shoulder row red x 15 Isometrics flexion, Er, abct, Ext, IR- updated HEP  Manual Therapy:  PROM shoulder flexion, abduction, IR , ER   [facility] Adult PT Treatment:                                                DATE: 03/19/22 Therapeutic Exercise: Pulleys elevation x 2 minutes ~  130 deg Shoulder row red x 15 UE ranger AAROM flexion x 10 ~ 130 deg UE ranger AAROM ER x 15 Standing cane (UE ranger) abduction ~45 deg Isometrics flexion, Er, abct, Ext Flexion wall slides 10 x 2   Manual Therapy: STW and IASTM upper arm, bicep Passive flexion, scaption, ER, IR   [facility] Adult PT Treatment:                                                DATE: 03/12/22 Therapeutic Exercise: Standing pendulums 3 way per HEP Isometrics flexion, Er , IR Ext, abduction 5 sec x 10 each  Standing wall slides AAROM Supine chest press   Manual Therapy: Passive ROM flexion, scap, abduct, ER, IR to tolerance- pt has difficulty relaxing  [facility] Adult PT Treatment:                                                DATE: 03/09/22 Therapeutic Exercise: Standing pendulums 3 way per HEP Standing flexion stretch- hands on high table with step back x 8 Standing scap squeeze  Seated AAROM ER with dowel 10 x 2  Seated abduction slides at table- x 10 Seated flexion slides at table x 10  Supine clasped hand chest press x 10  Manual Therapy: Passive ROM flexion, scap, abduct, ER, IR to tolerance- pt has difficulty relaxing   [facility] Adult PT Treatment:                                                DATE: 03/05/22 Therapeutic Exercise: Standing pendulums 3 way per HEP Standing flexion stretch- hands on high table with step back x 8 Standing scap squeeze  Seated AAROM ER with dowel 10 x 2  Seated abduction slides at table- x 10 +HEP Seated flexion slides at table x 10  +HEP   Manual Therapy: Passive ROM flexion, scap, abduct, ER, IR to tolerance- pt has difficulty relaxing   [facility] Adult PT Treatment:                                                DATE: 03/05/22 Therapeutic Exercise: Standing pendulums 3 way per HEP Standing flexion stretch- hands on high table with step back x 8 Standing scap squeeze  Seated AAROM ER with dowel 10 x 2  Seated abduction slides at table- x 10 +HEP Seated flexion  slides at table x 10  +HEP   Manual Therapy: Passive ROM flexion, scap, abduct, ER, IR to tolerance- pt has difficulty relaxing    [facility] Adult PT Treatment:                                                DATE: 03/01/22 Therapeutic Exercise: Standing pendulums 3 way per HEP Standing flexion stretch- hands on high table with step back x 8 +HEP Standing scap squeeze +HEP Seated PROM abduction- too painful Seated ER iso 5 sec x 5 - manual assist from PTA Seated IR iso  5 sec x 5 - manual assist from PTA  Manual Therapy: Passive ROM flexion, scap, abduct, ER, IR to tolerance- pt has difficulty relaxing     02-27-22 Eval and manual PROM/ issue pendulum exercises     PATIENT EDUCATION: Education details: POC Explanation of findings issue HEP,  education on TPDN and precautions and aftercare with handout Person educated: Patient and Child(ren) Education method: Explanation, Demonstration, Tactile cues, Verbal cues, and Handouts Education comprehension: verbalized understanding, returned demonstration, verbal cues required, tactile cues required, and needs further education     HOME EXERCISE PROGRAM: Access Code: [email or web address] URL: [email or web address] Date: 03/29/2022  updated HEP Prepared by: Voncille Lo  Exercises - Circular Shoulder Pendulum with Table Support  - 3 x daily - 7  x weekly - 2 sets - 10 reps - Flexion-Extension Shoulder Pendulum with Table Support  - 3 x daily - 7 x weekly - 2 sets - 10 reps - Horizontal Shoulder Pendulum with Table Support  - 3 x daily - 7 x weekly - 2 sets - 10 reps - Seated Scapular Retraction  - 1 x daily - 7 x weekly - 3 sets - 10 reps - Standing Shoulder and Trunk Flexion at Table  - 1 x daily - 7 x weekly - 1-2 sets - 10 reps - Seated Shoulder External Rotation AAROM with Dowel  - 1 x daily - 7 x weekly - 2 sets - 10 reps - 5 hold - Seated Shoulder Abduction Towel Slide at Table Top with Forearm in Neutral  - 1 x daily - 7 x  weekly - 3 sets - 10 reps - Seated Bilateral Shoulder Flexion Towel Slide at Table Top  - 1 x daily - 7 x weekly - 3 sets - 10 reps - Clasped hands chest press and pullover  - 1 x daily - 7 x weekly - 1 sets - 10 reps - Isometric Shoulder Flexion at Wall  - 1 x daily - 7 x weekly - 1-2 sets - 10 reps - 5 hold - Standing Isometric Shoulder Internal Rotation at Doorway  - 1 x daily - 7 x weekly - 1-2 sets - 10 reps - 5 hold - Standing Isometric Shoulder External Rotation with Doorway  - 1 x daily - 7 x weekly - 1-2 sets - 10 reps - 5 hold - Standing Isometric Shoulder Extension with Doorway - Arm Bent  - 1 x daily - 7 x weekly - 1-2 sets - 10 reps - 5 hold - Standing Shoulder Abduction Wall Slide with Thumb Out  - 1 x daily - 7 x weekly - 1-2 sets - 10 reps - Sidelying Shoulder ER with Towel and Dumbbell  - 1 x daily - 7 x weekly - 3 sets - 10 reps - Sidelying Shoulder Flexion 150 degrees  - 1 x daily - 7 x weekly - 3 sets - 10 reps - Sidelying Shoulder Horizontal Abduction  - 1 x daily - 7 x weekly - 3 sets - 10 reps - Sidelying Shoulder Abduction Palm Forward  - 1 x daily - 7 x weekly - 3 sets - 10 reps  Patient Education - Ice - Heat   ASSESSMENT:   CLINICAL IMPRESSION:  Pt enters clinic with 5/10 pain.  Pt is initially tentative about TPDN but then consents to TPDN and was able to increase weights with exercise today.  Pt was closely monitored throughout session with no adverse effects. Pt reports 2/10 pain after TPDN and before exercises.    Pt reports that she is not able to sleep on shld without waking up at night on  LEFT  side before TPDN but is able to perform eccentric load with RTB after TPDN today.   Pt utlizing 3 lb DB for exercises today and able to lift 15 # above head.  LTG # 5 met today. Will Continue toward completion of goals Pt does fatigue with exercise but she has better motion post RX      OBJECTIVE IMPAIRMENTS decreased ROM, decreased strength, impaired UE functional  use, postural dysfunction, and pain.    ACTIVITY LIMITATIONS carrying, lifting, sleeping, bathing, toileting, dressing, reach over head, and caring for others not being able to lift 30 # grandson   PARTICIPATION  LIMITATIONS:  cannot lift grandson and do housework   PERSONAL FACTORS Asthma, DM, Hyperlipidemia HTN. Mild cognitive impairment  are also affecting patient's functional outcome.    REHAB POTENTIAL: Good   CLINICAL DECISION MAKING: Evolving/moderate complexity   EVALUATION COMPLEXITY: Moderate     GOALS: Goals reviewed with patient? Yes   SHORT TERM GOALS: Target date: 03/27/2022  (Remove Blue Hyperlink)   Pt will be independent with initial HEP Baseline:noknowledge Goal status: MET 03/09/22   2.  Pt will gain PROM in Left shld flexion to at least 140 degrees Baseline: eval 90 degrees PROM Status 03/22/22: 134 flexion PROM Status: 03/27/22 140 flexion PROM Goal status: MET   3.  Pt will be able to sleep throughout night for at least 4 hours without waking utilizing pillows and positioning for  left shld comform Baseline: wakes at least 3 times a night and cannot sleep on left side 03-29-22  6hours of sleep Status: 03/22/22: wakes 2 x per night due to pain  Goal status: MET       LONG TERM GOALS: Target date: 04/24/2022  (Remove Blue Hyperlink)   Pt will be independent with advanced HEP Baseline: No knowledge 03-29-22 working on advanced HEP Goal status: ONGOING   2.  Pt will be able to hold grandson who is 30 # and carry without exacerbating pain in Left shoulder Baseline: Unable to lift any object or have any resistance at eval 04-10-22  Pt able to left 15 # BIL above head Goal status: ONGOING   3.  Pt will be able to sleep on Left side without waking during the night for at least 4-5 hours of uninterrupted sleep. Baseline: Pt wakes at least 3 time a night due to pain in left shld /unable to lie of left shld Status: 03/27/22 :pain wakes 2 x per night Goal status:  ONGOING   4.  Pt will be able to perform household chores like sweeping the floor and cooking  with pain 2/10 or less Baseline: unable to perform any household chores,  04-10-22  Pt able to cook but does not sweep with Left hand.   Goal status: ONGOING   5.  Pt will be able to perform prayers in childs pose without exacerbating pain in arms Baseline: unable to pray in Muslim prayer posture due to arm pain and decreased AROM Goal status: MET 04-10-22   6.  FOTO will improve from 32%   to  57%     indicating improved functional mobility . Baseline: eval 32% Status: 03/27/22 (8th visit) : 54% Goal status: ONGOING   7. Pt will be able to lift dishes above head to put in cabinet in order to complete kitchen work.            Baseline:  unable to lift left UE 04-10-22  Pt able to lift BIL OH press with 15 #            GOAL status ONGOING     PLAN: PT FREQUENCY: 2x/week   PT DURATION: 8 weeks   PLANNED INTERVENTIONS: Therapeutic exercises, Therapeutic activity, Neuromuscular re-education, Patient/Family education, Joint mobilization, Dry Needling, Electrical stimulation, Cryotherapy, Moist heat, Taping, Ionotophoresis 13m/ml Dexamethasone, Manual therapy, and Re-evaluation   PLAN FOR NEXT SESSION: continue isometrics and continue AAROM, PROM, repeat yellow band RTC if tolerated    Work on sweeping and lifting above head.  Able to do 15 # easily.  PT assists with 20# but then was able to lift x 5  1/2 range with elbows bent  Work on progressing strength.   Voncille Lo, PT, [place] Certified Exercise Expert for the Aging Adult  04/10/22 9:03 AM Phone: [phone] Fax: [phone]

## 2022-04-10 ENCOUNTER — Ambulatory Visit: Payer: 59 | Admitting: Physical Therapy

## 2022-04-10 DIAGNOSIS — M25512 Pain in left shoulder: Secondary | ICD-10-CM

## 2022-04-10 DIAGNOSIS — M6281 Muscle weakness (generalized): Secondary | ICD-10-CM

## 2022-04-10 NOTE — Patient Instructions (Signed)

## 2022-04-12 ENCOUNTER — Encounter: Payer: Self-pay | Admitting: Physical Therapy

## 2022-04-12 ENCOUNTER — Ambulatory Visit: Payer: 59 | Admitting: Physical Therapy

## 2022-04-12 DIAGNOSIS — M25512 Pain in left shoulder: Secondary | ICD-10-CM | POA: Diagnosis not present

## 2022-04-12 DIAGNOSIS — M6281 Muscle weakness (generalized): Secondary | ICD-10-CM

## 2022-04-12 NOTE — Therapy (Signed)
OUTPATIENT PHYSICAL THERAPY TREATMENT NOTE   Patient Name: Gabriella Norman MRN: [id number] DOB:1956-01-19, 66 y.o., female Today's Date: 04/12/2022 PCP: Scheryl Marten, [place]   REFERRING PROVIDER: Aundra Dubin, PA-C  END OF SESSION:   PT End of Session - 04/12/22 0806     Visit Number 12    Number of Visits 16    Date for PT Re-Evaluation 04/24/22    Authorization Type [employer]    Authorization - Number of Visits 30    PT Start Time 0802    PT Stop Time 0840    PT Time Calculation (min) 38 min                Past Medical History:  Diagnosis Date   Asthma    DM (diabetes mellitus) ([place])    Hyperlipidemia    Hypertension    Past Surgical History:  Procedure Laterality Date   SHOULDER ARTHROSCOPY WITH ROTATOR CUFF REPAIR AND SUBACROMIAL DECOMPRESSION Left 02/09/2022   Procedure: LEFT SHOULDER ARTHROSCOPY, EXTENSIVE DEBRIDEMENT, SUBACROMIAL DECOMPRESSION;  Surgeon: Leandrew Koyanagi, MD;  Location: [facility];  Service: Orthopedics;  Laterality: Left;   TUBAL LIGATION     Patient Active Problem List   Diagnosis Date Noted   Superior labrum anterior-to-posterior (SLAP) tear of right shoulder 09/29/2021   Mild cognitive impairment 07/06/2021   Memory loss 04/03/2021   Pure hypercholesterolemia 04/23/2019   Type 2 diabetes mellitus without complication, without long-term current use of insulin ([place]) 04/23/2019   Benign essential HTN 04/17/2019   Cough variant asthma vs UACS 09/23/2018    REFERRING DIAG: Z98.890 (ICD-10-CM) - S/P arthroscopy of left shoulder   THERAPY DIAG:  Acute pain of left shoulder  Muscle weakness (generalized)  Rationale for Evaluation and Treatment Rehabilitation  PERTINENT HISTORY: Asthma, DM, Hyperlipidemia HTN. Mild cognitive impairment  Presently LEFT SHOULDER ARTHROSCOPY, EXTENSIVE DEBRIDEMENT, SUBACROMIAL DECOMPRESSION;  Superior labrum anterior-to-posterior (SLAP) tear of right shoulder   PRECAUTIONS:  Shoulder No lifting, no resistance over biceps tenotomy PROM initially   SUBJECTIVE: I am a 3/10 today. It did not bother me last night. I blocked myself from turing over with a pillow.  PAIN:  Are you having pain? Yes: NPRS scale: 3/10 Pain location: Left shoulder Pain description: sharp pain Aggravating factors: moving it.cant sleep on my arm, cannot pick up any items with arm Relieving factors: medication, utilizing pillow   OBJECTIVE: (objective measures completed at initial evaluation unless otherwise dated)   DIAGNOSTIC FINDINGS:  FINDINGS: 4-20-22There is no evidence of fracture or dislocation. There is no evidence of arthropathy or other focal bone abnormality. Soft tissues are unremarkable.   IMPRESSION: Negative.   PATIENT SURVEYS:  FOTO 32% eval  57% predicted FOTO status 03/27/22: 52% FOTO status 04-03-22 54%   COGNITION:           Overall cognitive status: Within functional limits for tasks assessed  mild cognitive impairment in history  with daughter for eval                                  SENSATION: WFL Light touch: Impaired  pt with some numbness into Left forearm    POSTURE: Forward head and rounded shoulders left more forward than Right   UPPER EXTREMITY ROM:    Active/ Passive ROM Right eval Left eval Left 03/01/22 Left 03/05/22 Left  03/09/22 Left 03/12/22 Left 03/22/22 Left 03/27/22 Left 03-27-22 Left\ AROM  04/12/22  Shoulder flexion 160/P 165 50/P-90 P 100 P 125 A 95 P130 A117/ P134 P 140 P143 Pain A140  Shoulder extension 35            Shoulder abduction 157 50- 80 P 80 P90 A 55 P110 85  85 Pain A 135  Shoulder adduction              Shoulder internal rotation 90 P30       To L1 with pain L1 no pain  Shoulder external rotation 60 P 0 P45  P60  P80 @ 45    T 2 no pain  Elbow flexion              Elbow extension              Wrist flexion              Wrist extension              Wrist ulnar deviation              Wrist radial deviation               Wrist pronation              Wrist supination              (Blank rows = not tested)   UPPER EXTREMITY MMT:       Not tested due to post surgery Pt 5/5 on R   MMT Right eval Left eval Left 03-29-22  Shoulder flexion     4-  Shoulder extension     4-  Shoulder abduction     4-  Shoulder adduction       Shoulder internal rotation     3+  Shoulder external rotation     4-  Middle trapezius       Lower trapezius       Elbow flexion       Elbow extension       Wrist flexion       Wrist extension       Wrist ulnar deviation       Wrist radial deviation       Wrist pronation       Wrist supination       Grip strength (lbs)       (Blank rows = not tested)   SHOULDER SPECIAL TESTS:            NT due to recent shld arthroscopy biceps tenotomy and SAD   JOINT MOBILITY TESTING:  NT due to recent surgery   PALPATION:  TTP over incision and over Left biceps muscle belly             TODAY'S TREATMENT:  [facility] Adult PT Treatment:                                                DATE: 04-12-22 Therapeutic Exercise: UBE L1 2 min each way  R side lying ER  L  and towel and 3 lb weight  3 x 10 3 sec hold R side lying flexion  L  3 lb weight  3 x 10 3 sec hold R side lying abduction  L 3 lb weight  3 x 10 3 sec hold BIL OH press with 15 #  1 x 10 BIl curl to [facility] press 15# 1 x 10  Horizontal Abd 3 x 10 RTB Diagonals 3 x 10 RTB Needed TC and VC to perform correctly, left Green band tricep ext 10 x 3  Green band bicep curl 10 x 3    [facility] Adult PT Treatment:                                                DATE: 04-10-22 Therapeutic Exercise: R side lying ER  L with  2 lb DB and towel and 3 lb weight  3 x 10 3 sec hold R side lying flexion  L with 2 lb DB  and 3 lb weight  3 x 10 3 sec hold R side lying abduction  L with 2 lb DB  and 3 lb weight  3 x 10 3 sec hold BIL OH press with 15 #  2 x 10 Horizontal Abd 3 x 10 RTB Diagonals 3 x 10 RTB Needed TC and VC to perform  correctly Left Bicept 5 lb  3 x 10 L sidelying with RTB eccentric ER  with shld 90 degree and Video on pt phone for use at home 3 x 10 with PT assist BIL OH press with 20 # with PT assist. Pt very tentative but then able to perform 2 x 5 with rest 1/2 range just above head.  Manual Therapy: STW of left deltoid and left biceps PROM shoulder flexion, abduction, IR , ER  Isometric at various angles for shld abd/ flexion Trigger Point Dry Needling Treatment: Pre-treatment instruction: Patient instructed on dry needling rationale, procedures, and possible side effects including pain during treatment (achy,cramping feeling), bruising, drop of blood, lightheadedness, nausea, sweating. Patient Consent Given: Yes Education handout provided: Yes Muscles treated: Left deltoid ant, middle  Needle size and number: .30x57m x 1  to use in multiple places over left deltoid  ant/middle Electrical stimulation performed: No Parameters: N/A Treatment response/outcome: Twitch response elicited and Palpable decrease in muscle tension Post-treatment instructions: Patient instructed to expect possible mild to moderate muscle soreness later today and/or tomorrow. Patient instructed in methods to reduce muscle soreness and to continue prescribed HEP. If patient was dry needled over the lung field, patient was instructed on signs and symptoms of pneumothorax and, however unlikely, to see immediate medical attention should they occur. Patient was also educated on signs and symptoms of infection and to seek medical attention should they occur. Patient verbalized understanding of these instructions and education.  Modalities - Moist hot pack to left deltoid   [facility]                                                [facility]: 03[facility] FOTO 10th visit 54% predicted 57% Therapeutic Exercise: UBE 2.30 min forward and then 2.30 min backward. VC for posture and chin tuck R side lying ER L with  2 lb DB and towel  and 2 lb weight  3 x 10 3 sec hold R side lying flexion  L with 2 lb DB  and 2 lb weight  3 x 10 3 sec hold R side lying abduction L with 2 lb DB  and 2 lb  weight  3 x 10 3 sec hold L sidelying with RTB eccentric ER  with shld 90 degree and Video on pt phone for use at home 3 x 10 with PT assist Horizontal abd RTB 3 x 10 in supine with hooklying.  VC for pt to slow down and 3 sec hold Diagonals  RTB 3 x 10 in supine with hooklying.  VC for pt to slow down and 3 sec hold Sitting with single Left OH press with 2 lb 2 x 10 Manual Therapy: PROM shoulder flexion, abduction, IR , ER  Isometric at various angles for shld abd/ flexion   [facility] Adult PT Treatment:                                                DATE: 03-29-22  Therapeutic Exercise: Flexion wall slides 10 x 2  Left shoulder abduction wall slides 10 x 2 -reaches over 130 degrees Standing cane flexion and abduction x 10 each R side lying ER with  2 lb DB and towel and 2 lb weight  3 x 10 3 sec hold R side lying flexion with 2 lb DB  and 2 lb weight  3 x 10 3 sec hold R side lying abduction with 2 lb DB  and 2 lb weight  3 x 10 3 sec hold Red band IR, ER x 10 each  Shoulder row red  1 x 15 Shoulder ext bilat red x 15 Isometrics flexion 5 sec x 10 Supine chest press  Manual Therapy: PROM shoulder flexion, abduction, IR , ER     [facility] Adult PT Treatment:                                                DATE: 03/27/22 Therapeutic Exercise: Flexion wall slides 10 x 2  Left shoulder abduction wall slides 10 x 2 -reaches over 130 degrees Standing cane flexion and abduction x 10 each UBE level 1 x 3 min forward Pulleys elevation x 2 minutes ~ 130 deg Yellow band IR, ER x 10 each  Shoulder row red  1 x 15 Shoulder ext bilat red x 15 Isometrics flexion 5 sec x 10 Supine chest press Supine pullover with dowel Sidelying ER AROM x15 Sidelying abduction AROM x 6  Clasped hand assisted shoulder PROM for flexion to 140   Manual Therapy:   PROM shoulder flexion, abduction, IR , ER   [facility] Adult PT Treatment:                                                DATE: 03/22/22 Therapeutic Exercise: Flexion wall slides 10 x 2  Left shoulder abduction wall slides 10 x 2  Pulleys elevation x 2 minutes ~ 130 deg Shoulder row red x 15 Isometrics flexion, Er, abct, Ext, IR- updated HEP  Manual Therapy:  PROM shoulder flexion, abduction, IR , ER   [facility] Adult PT Treatment:  DATE: 03/19/22 Therapeutic Exercise: Pulleys elevation x 2 minutes ~ 130 deg Shoulder row red x 15 UE ranger AAROM flexion x 10 ~ 130 deg UE ranger AAROM ER x 15 Standing cane (UE ranger) abduction ~45 deg Isometrics flexion, Er, abct, Ext Flexion wall slides 10 x 2   Manual Therapy: STW and IASTM upper arm, bicep Passive flexion, scaption, ER, IR   [facility] Adult PT Treatment:                                                DATE: 03/12/22 Therapeutic Exercise: Standing pendulums 3 way per HEP Isometrics flexion, Er , IR Ext, abduction 5 sec x 10 each  Standing wall slides AAROM Supine chest press   Manual Therapy: Passive ROM flexion, scap, abduct, ER, IR to tolerance- pt has difficulty relaxing  [facility] Adult PT Treatment:                                                DATE: 03/09/22 Therapeutic Exercise: Standing pendulums 3 way per HEP Standing flexion stretch- hands on high table with step back x 8 Standing scap squeeze  Seated AAROM ER with dowel 10 x 2  Seated abduction slides at table- x 10 Seated flexion slides at table x 10  Supine clasped hand chest press x 10  Manual Therapy: Passive ROM flexion, scap, abduct, ER, IR to tolerance- pt has difficulty relaxing   [facility] Adult PT Treatment:                                                DATE: 03/05/22 Therapeutic Exercise: Standing pendulums 3 way per HEP Standing flexion stretch- hands on high table with step back x 8 Standing scap squeeze  Seated AAROM  ER with dowel 10 x 2  Seated abduction slides at table- x 10 +HEP Seated flexion slides at table x 10  +HEP   Manual Therapy: Passive ROM flexion, scap, abduct, ER, IR to tolerance- pt has difficulty relaxing   [facility] Adult PT Treatment:                                                DATE: 03/05/22 Therapeutic Exercise: Standing pendulums 3 way per HEP Standing flexion stretch- hands on high table with step back x 8 Standing scap squeeze  Seated AAROM ER with dowel 10 x 2  Seated abduction slides at table- x 10 +HEP Seated flexion slides at table x 10  +HEP   Manual Therapy: Passive ROM flexion, scap, abduct, ER, IR to tolerance- pt has difficulty relaxing    [facility] Adult PT Treatment:                                                DATE: 03/01/22 Therapeutic Exercise: Standing pendulums 3 way per HEP Standing  flexion stretch- hands on high table with step back x 8 +HEP Standing scap squeeze +HEP Seated PROM abduction- too painful Seated ER iso 5 sec x 5 - manual assist from PTA Seated IR iso  5 sec x 5 - manual assist from PTA  Manual Therapy: Passive ROM flexion, scap, abduct, ER, IR to tolerance- pt has difficulty relaxing     02-27-22 Eval and manual PROM/ issue pendulum exercises     PATIENT EDUCATION: Education details: POC Explanation of findings issue HEP,  education on TPDN and precautions and aftercare with handout Person educated: Patient and Child(ren) Education method: Explanation, Demonstration, Tactile cues, Verbal cues, and Handouts Education comprehension: verbalized understanding, returned demonstration, verbal cues required, tactile cues required, and needs further education     HOME EXERCISE PROGRAM: Access Code: [email or web address] URL: [email or web address] Date: 03/29/2022  updated HEP Prepared by: Voncille Lo  Exercises - Circular Shoulder Pendulum with Table Support  - 3 x daily - 7 x weekly - 2 sets - 10 reps - Flexion-Extension Shoulder  Pendulum with Table Support  - 3 x daily - 7 x weekly - 2 sets - 10 reps - Horizontal Shoulder Pendulum with Table Support  - 3 x daily - 7 x weekly - 2 sets - 10 reps - Seated Scapular Retraction  - 1 x daily - 7 x weekly - 3 sets - 10 reps - Standing Shoulder and Trunk Flexion at Table  - 1 x daily - 7 x weekly - 1-2 sets - 10 reps - Seated Shoulder External Rotation AAROM with Dowel  - 1 x daily - 7 x weekly - 2 sets - 10 reps - 5 hold - Seated Shoulder Abduction Towel Slide at Table Top with Forearm in Neutral  - 1 x daily - 7 x weekly - 3 sets - 10 reps - Seated Bilateral Shoulder Flexion Towel Slide at Table Top  - 1 x daily - 7 x weekly - 3 sets - 10 reps - Clasped hands chest press and pullover  - 1 x daily - 7 x weekly - 1 sets - 10 reps - Isometric Shoulder Flexion at Wall  - 1 x daily - 7 x weekly - 1-2 sets - 10 reps - 5 hold - Standing Isometric Shoulder Internal Rotation at Doorway  - 1 x daily - 7 x weekly - 1-2 sets - 10 reps - 5 hold - Standing Isometric Shoulder External Rotation with Doorway  - 1 x daily - 7 x weekly - 1-2 sets - 10 reps - 5 hold - Standing Isometric Shoulder Extension with Doorway - Arm Bent  - 1 x daily - 7 x weekly - 1-2 sets - 10 reps - 5 hold - Standing Shoulder Abduction Wall Slide with Thumb Out  - 1 x daily - 7 x weekly - 1-2 sets - 10 reps - Sidelying Shoulder ER with Towel and Dumbbell  - 1 x daily - 7 x weekly - 3 sets - 10 reps - Sidelying Shoulder Flexion 150 degrees  - 1 x daily - 7 x weekly - 3 sets - 10 reps - Sidelying Shoulder Horizontal Abduction  - 1 x daily - 7 x weekly - 3 sets - 10 reps - Sidelying Shoulder Abduction Palm Forward  - 1 x daily - 7 x weekly - 3 sets - 10 reps  Patient Education - Ice - Heat   ASSESSMENT:   CLINICAL IMPRESSION:  Pt reports pain level lower at 3/10. Initially she had  increased pain after TPD and then she felt better. AROM improved to 140 flexion and 135 abduction in standing. Continued with  strengthening focus with cues for technique. She fatigues and reports min pain in lateral upper arm. Will Continue toward completion of goals.      OBJECTIVE IMPAIRMENTS decreased ROM, decreased strength, impaired UE functional use, postural dysfunction, and pain.    ACTIVITY LIMITATIONS carrying, lifting, sleeping, bathing, toileting, dressing, reach over head, and caring for others not being able to lift 30 # grandson   PARTICIPATION LIMITATIONS:  cannot lift grandson and do housework   PERSONAL FACTORS Asthma, DM, Hyperlipidemia HTN. Mild cognitive impairment  are also affecting patient's functional outcome.    REHAB POTENTIAL: Good   CLINICAL DECISION MAKING: Evolving/moderate complexity   EVALUATION COMPLEXITY: Moderate     GOALS: Goals reviewed with patient? Yes   SHORT TERM GOALS: Target date: 03/27/2022  (Remove Blue Hyperlink)   Pt will be independent with initial HEP Baseline:noknowledge Goal status: MET 03/09/22   2.  Pt will gain PROM in Left shld flexion to at least 140 degrees Baseline: eval 90 degrees PROM Status 03/22/22: 134 flexion PROM Status: 03/27/22 140 flexion PROM Goal status: MET   3.  Pt will be able to sleep throughout night for at least 4 hours without waking utilizing pillows and positioning for  left shld comform Baseline: wakes at least 3 times a night and cannot sleep on left side 03-29-22  6hours of sleep Status: 03/22/22: wakes 2 x per night due to pain  Goal status: MET       LONG TERM GOALS: Target date: 04/24/2022  (Remove Blue Hyperlink)   Pt will be independent with advanced HEP Baseline: No knowledge 03-29-22 working on advanced HEP Goal status: ONGOING   2.  Pt will be able to hold grandson who is 30 # and carry without exacerbating pain in Left shoulder Baseline: Unable to lift any object or have any resistance at eval 04-10-22  Pt able to left 15 # BIL above head Goal status: ONGOING   3.  Pt will be able to sleep on Left side without  waking during the night for at least 4-5 hours of uninterrupted sleep. Baseline: Pt wakes at least 3 time a night due to pain in left shld /unable to lie of left shld Status: 03/27/22 :pain wakes 2 x per night Goal status: ONGOING   4.  Pt will be able to perform household chores like sweeping the floor and cooking  with pain 2/10 or less Baseline: unable to perform any household chores,  04-10-22  Pt able to cook but does not sweep with Left hand.   Goal status: ONGOING   5.  Pt will be able to perform prayers in childs pose without exacerbating pain in arms Baseline: unable to pray in Muslim prayer posture due to arm pain and decreased AROM Goal status: MET 04-10-22   6.  FOTO will improve from 32%   to  57%     indicating improved functional mobility . Baseline: eval 32% Status: 03/27/22 (8th visit) : 54% Goal status: ONGOING   7. Pt will be able to lift dishes above head to put in cabinet in order to complete kitchen work.            Baseline:  unable to lift left UE 04-10-22  Pt able to lift BIL OH press with 15 #            GOAL status  ONGOING     PLAN: PT FREQUENCY: 2x/week   PT DURATION: 8 weeks   PLANNED INTERVENTIONS: Therapeutic exercises, Therapeutic activity, Neuromuscular re-education, Patient/Family education, Joint mobilization, Dry Needling, Electrical stimulation, Cryotherapy, Moist heat, Taping, Ionotophoresis 66m/ml Dexamethasone, Manual therapy, and Re-evaluation   PLAN FOR NEXT SESSION: continue isometrics and continue AAROM, PROM, repeat yellow band RTC if tolerated    Work on sweeping and lifting above head.  Able to do 15 # easily.  PT assists with 20# but then was able to lift x 5 1/2 range with elbows bent  Work on progressing strength.   JHessie Diener PTA 04/12/22 9:03 AM Phone: [phone]: [phone]

## 2022-04-17 ENCOUNTER — Encounter: Payer: Self-pay | Admitting: Physical Therapy

## 2022-04-17 ENCOUNTER — Ambulatory Visit: Payer: 59 | Attending: Physician Assistant | Admitting: Physical Therapy

## 2022-04-17 DIAGNOSIS — M6281 Muscle weakness (generalized): Secondary | ICD-10-CM | POA: Insufficient documentation

## 2022-04-17 DIAGNOSIS — M25512 Pain in left shoulder: Secondary | ICD-10-CM | POA: Insufficient documentation

## 2022-04-17 NOTE — Therapy (Signed)
OUTPATIENT PHYSICAL THERAPY TREATMENT NOTE   Patient Name: Gabriella Norman MRN: [id number] DOB:09/03/1956, 66 y.o., female Today's Date: 04/17/2022 PCP: Scheryl Marten, [place]   REFERRING PROVIDER: Aundra Dubin, PA-C  END OF SESSION:   PT End of Session - 04/17/22 0720     Visit Number 13    Number of Visits 16    Date for PT Re-Evaluation 04/24/22    Authorization Type [employer]    Authorization - Number of Visits 30    PT Start Time [phone]    PT Stop Time 0755    PT Time Calculation (min) 38 min                Past Medical History:  Diagnosis Date   Asthma    DM (diabetes mellitus) ([place])    Hyperlipidemia    Hypertension    Past Surgical History:  Procedure Laterality Date   SHOULDER ARTHROSCOPY WITH ROTATOR CUFF REPAIR AND SUBACROMIAL DECOMPRESSION Left 02/09/2022   Procedure: LEFT SHOULDER ARTHROSCOPY, EXTENSIVE DEBRIDEMENT, SUBACROMIAL DECOMPRESSION;  Surgeon: Leandrew Koyanagi, MD;  Location: [facility];  Service: Orthopedics;  Laterality: Left;   TUBAL LIGATION     Patient Active Problem List   Diagnosis Date Noted   Superior labrum anterior-to-posterior (SLAP) tear of right shoulder 09/29/2021   Mild cognitive impairment 07/06/2021   Memory loss 04/03/2021   Pure hypercholesterolemia 04/23/2019   Type 2 diabetes mellitus without complication, without long-term current use of insulin ([place]) 04/23/2019   Benign essential HTN 04/17/2019   Cough variant asthma vs UACS 09/23/2018    REFERRING DIAG: Z98.890 (ICD-10-CM) - S/P arthroscopy of left shoulder   THERAPY DIAG:  Acute pain of left shoulder  Muscle weakness (generalized)  Rationale for Evaluation and Treatment Rehabilitation  PERTINENT HISTORY: Asthma, DM, Hyperlipidemia HTN. Mild cognitive impairment  Presently LEFT SHOULDER ARTHROSCOPY, EXTENSIVE DEBRIDEMENT, SUBACROMIAL DECOMPRESSION;  Superior labrum anterior-to-posterior (SLAP) tear of right shoulder   PRECAUTIONS:  Shoulder No lifting, no resistance over biceps tenotomy PROM initially   SUBJECTIVE: I am a 2/10 today.   PAIN:  Are you having pain? Yes: NPRS scale: 2/10 Pain location: Left shoulder Pain description: sharp pain Aggravating factors: moving it.cant sleep on my arm, cannot pick up any items with arm Relieving factors: medication, utilizing pillow   OBJECTIVE: (objective measures completed at initial evaluation unless otherwise dated)   DIAGNOSTIC FINDINGS:  FINDINGS: 4-20-22There is no evidence of fracture or dislocation. There is no evidence of arthropathy or other focal bone abnormality. Soft tissues are unremarkable.   IMPRESSION: Negative.   PATIENT SURVEYS:  FOTO 32% eval  57% predicted FOTO status 03/27/22: 52% FOTO status 04-03-22 54%   COGNITION:           Overall cognitive status: Within functional limits for tasks assessed  mild cognitive impairment in history  with daughter for eval                                  SENSATION: WFL Light touch: Impaired  pt with some numbness into Left forearm    POSTURE: Forward head and rounded shoulders left more forward than Right   UPPER EXTREMITY ROM:    Active/ Passive ROM Right eval Left eval Left 03/01/22 Left 03/05/22 Left  03/09/22 Left 03/12/22 Left 03/22/22 Left 03/27/22 Left 03-27-22 Left\ AROM 04/12/22  Shoulder flexion 160/P 165 50/P-90 P 100 P 125 A 95 P130 A117/  P134 P 140 P143 Pain A140  Shoulder extension 35            Shoulder abduction 157 50- 80 P 80 P90 A 55 P110 85  85 Pain A 135  Shoulder adduction              Shoulder internal rotation 90 P30       To L1 with pain L1 no pain  Shoulder external rotation 60 P 0 P45  P60  P80 @ 45    T 2 no pain  Elbow flexion              Elbow extension              Wrist flexion              Wrist extension              Wrist ulnar deviation              Wrist radial deviation              Wrist pronation              Wrist supination              (Blank  rows = not tested)   UPPER EXTREMITY MMT:       Not tested due to post surgery Pt 5/5 on R   MMT Right eval Left eval Left 03-29-22 Left  04/17/22  Shoulder flexion     4- 4-  Shoulder extension     4-   Shoulder abduction     4- 4-  Shoulder adduction        Shoulder internal rotation     3+ 4  Shoulder external rotation     4- 4  Middle trapezius        Lower trapezius        Elbow flexion        Elbow extension        Wrist flexion        Wrist extension        Wrist ulnar deviation        Wrist radial deviation        Wrist pronation        Wrist supination        Grip strength (lbs)        (Blank rows = not tested)   SHOULDER SPECIAL TESTS:            NT due to recent shld arthroscopy biceps tenotomy and SAD   JOINT MOBILITY TESTING:  NT due to recent surgery   PALPATION:  TTP over incision and over Left biceps muscle belly             TODAY'S TREATMENT:  [facility] Adult PT Treatment:                                                DATE: 04-17-22 Therapeutic Exercise: UBE L2 2 min each way  BIL OH press with 20 #  1 x 10-(CGA on dumbell ) BIl curl to [facility] press 15# 1 x 10 Star Pattern Green band x 10  Green band tricep ext 10 x 3  Green band bicep curl 10 x 3  R side lying ER  L  and towel  and 4 lb weight  3 x 10 3 sec hold R side lying abduction  L 3 lb weight  3 x 10 3 sec hold R side lying flexion  L  3 lb weight  3 x 10 3 sec hold Standing Green band IR 10 x 3 4# OH Cabinet reach 10 x 3 top shelf    [facility] Adult PT Treatment:                                                DATE: 04-12-22 Therapeutic Exercise: UBE L1 2 min each way  R side lying ER  L  and towel and 3 lb weight  3 x 10 3 sec hold R side lying flexion  L  3 lb weight  3 x 10 3 sec hold R side lying abduction  L 3 lb weight  3 x 10 3 sec hold BIL OH press with 15 #  1 x 10 BIl curl to OH press 15# 1 x 10  Horizontal Abd 3 x 10 RTB Diagonals 3 x 10 GTB Needed TC and VC to perform correctly,  left Green band tricep ext 10 x 3  Green band bicep curl 10 x 3    [facility] Adult PT Treatment:                                                DATE: 04-10-22 Therapeutic Exercise: R side lying ER  L with  2 lb DB and towel and 3 lb weight  3 x 10 3 sec hold R side lying flexion  L with 2 lb DB  and 3 lb weight  3 x 10 3 sec hold R side lying abduction  L with 2 lb DB  and 3 lb weight  3 x 10 3 sec hold BIL OH press with 15 #  2 x 10 Horizontal Abd 3 x 10 RTB Diagonals 3 x 10 RTB Needed TC and VC to perform correctly Left Bicept 5 lb  3 x 10 L sidelying with RTB eccentric ER  with shld 90 degree and Video on pt phone for use at home 3 x 10 with PT assist BIL OH press with 20 # with PT assist. Pt very tentative but then able to perform 2 x 5 with rest 1/2 range just above head.  Manual Therapy: STW of left deltoid and left biceps PROM shoulder flexion, abduction, IR , ER  Isometric at various angles for shld abd/ flexion Trigger Point Dry Needling Treatment: Pre-treatment instruction: Patient instructed on dry needling rationale, procedures, and possible side effects including pain during treatment (achy,cramping feeling), bruising, drop of blood, lightheadedness, nausea, sweating. Patient Consent Given: Yes Education handout provided: Yes Muscles treated: Left deltoid ant, middle  Needle size and number: .30x85mm x 1  to use in multiple places over left deltoid  ant/middle Electrical stimulation performed: No Parameters: N/A Treatment response/outcome: Twitch response elicited and Palpable decrease in muscle tension Post-treatment instructions: Patient instructed to expect possible mild to moderate muscle soreness later today and/or tomorrow. Patient instructed in methods to reduce muscle soreness and to continue prescribed HEP. If patient was dry needled over the lung field, patient was instructed on signs and symptoms  of pneumothorax and, however unlikely, to see immediate medical attention  should they occur. Patient was also educated on signs and symptoms of infection and to seek medical attention should they occur. Patient verbalized understanding of these instructions and education.  Modalities - Moist hot pack to left deltoid   [facility] Adult PT Treatment:                                                DATE: 03-29-22 FOTO 10th visit 54% predicted 57% Therapeutic Exercise: UBE 2.30 min forward and then 2.30 min backward. VC for posture and chin tuck R side lying ER L with  2 lb DB and towel and 2 lb weight  3 x 10 3 sec hold R side lying flexion  L with 2 lb DB  and 2 lb weight  3 x 10 3 sec hold R side lying abduction L with 2 lb DB  and 2 lb weight  3 x 10 3 sec hold L sidelying with RTB eccentric ER  with shld 90 degree and Video on pt phone for use at home 3 x 10 with PT assist Horizontal abd RTB 3 x 10 in supine with hooklying.  VC for pt to slow down and 3 sec hold Diagonals  RTB 3 x 10 in supine with hooklying.  VC for pt to slow down and 3 sec hold Sitting with single Left OH press with 2 lb 2 x 10 Manual Therapy: PROM shoulder flexion, abduction, IR , ER  Isometric at various angles for shld abd/ flexion   [facility] Adult PT Treatment:                                                DATE: 03-29-22  Therapeutic Exercise: Flexion wall slides 10 x 2  Left shoulder abduction wall slides 10 x 2 -reaches over 130 degrees Standing cane flexion and abduction x 10 each R side lying ER with  2 lb DB and towel and 2 lb weight  3 x 10 3 sec hold R side lying flexion with 2 lb DB  and 2 lb weight  3 x 10 3 sec hold R side lying abduction with 2 lb DB  and 2 lb weight  3 x 10 3 sec hold Red band IR, ER x 10 each  Shoulder row red  1 x 15 Shoulder ext bilat red x 15 Isometrics flexion 5 sec x 10 Supine chest press  Manual Therapy: PROM shoulder flexion, abduction, IR , ER     [facility] Adult PT Treatment:                                                DATE: 03/27/22 Therapeutic  Exercise: Flexion wall slides 10 x 2  Left shoulder abduction wall slides 10 x 2 -reaches over 130 degrees Standing cane flexion and abduction x 10 each UBE level 1 x 3 min forward Pulleys elevation x 2 minutes ~ 130 deg Yellow band IR, ER x 10 each  Shoulder row red  1 x 15 Shoulder ext bilat red x  15 Isometrics flexion 5 sec x 10 Supine chest press Supine pullover with dowel Sidelying ER AROM x15 Sidelying abduction AROM x 6  Clasped hand assisted shoulder PROM for flexion to 140   Manual Therapy:  PROM shoulder flexion, abduction, IR , ER   [facility] Adult PT Treatment:                                                DATE: 03/22/22 Therapeutic Exercise: Flexion wall slides 10 x 2  Left shoulder abduction wall slides 10 x 2  Pulleys elevation x 2 minutes ~ 130 deg Shoulder row red x 15 Isometrics flexion, Er, abct, Ext, IR- updated HEP  Manual Therapy:  PROM shoulder flexion, abduction, IR , ER   [facility] Adult PT Treatment:                                                DATE: 03/19/22 Therapeutic Exercise: Pulleys elevation x 2 minutes ~ 130 deg Shoulder row red x 15 UE ranger AAROM flexion x 10 ~ 130 deg UE ranger AAROM ER x 15 Standing cane (UE ranger) abduction ~45 deg Isometrics flexion, Er, abct, Ext Flexion wall slides 10 x 2   Manual Therapy: STW and IASTM upper arm, bicep Passive flexion, scaption, ER, IR   [facility] Adult PT Treatment:                                                DATE: 03/12/22 Therapeutic Exercise: Standing pendulums 3 way per HEP Isometrics flexion, Er , IR Ext, abduction 5 sec x 10 each  Standing wall slides AAROM Supine chest press   Manual Therapy: Passive ROM flexion, scap, abduct, ER, IR to tolerance- pt has difficulty relaxing  [facility] Adult PT Treatment:                                                DATE: 03/09/22 Therapeutic Exercise: Standing pendulums 3 way per HEP Standing flexion stretch- hands on high table with step back x 8 Standing  scap squeeze  Seated AAROM ER with dowel 10 x 2  Seated abduction slides at table- x 10 Seated flexion slides at table x 10  Supine clasped hand chest press x 10  Manual Therapy: Passive ROM flexion, scap, abduct, ER, IR to tolerance- pt has difficulty relaxing   [facility] Adult PT Treatment:                                                DATE: 03/05/22 Therapeutic Exercise: Standing pendulums 3 way per HEP Standing flexion stretch- hands on high table with step back x 8 Standing scap squeeze  Seated AAROM ER with dowel 10 x 2  Seated abduction slides at table- x 10 +HEP Seated flexion slides at table x 10  +HEP  Manual Therapy: Passive ROM flexion, scap, abduct, ER, IR to tolerance- pt has difficulty relaxing   [facility] Adult PT Treatment:                                                DATE: 03/05/22 Therapeutic Exercise: Standing pendulums 3 way per HEP Standing flexion stretch- hands on high table with step back x 8 Standing scap squeeze  Seated AAROM ER with dowel 10 x 2  Seated abduction slides at table- x 10 +HEP Seated flexion slides at table x 10  +HEP   Manual Therapy: Passive ROM flexion, scap, abduct, ER, IR to tolerance- pt has difficulty relaxing    [facility] Adult PT Treatment:                                                DATE: 03/01/22 Therapeutic Exercise: Standing pendulums 3 way per HEP Standing flexion stretch- hands on high table with step back x 8 +HEP Standing scap squeeze +HEP Seated PROM abduction- too painful Seated ER iso 5 sec x 5 - manual assist from PTA Seated IR iso  5 sec x 5 - manual assist from PTA  Manual Therapy: Passive ROM flexion, scap, abduct, ER, IR to tolerance- pt has difficulty relaxing     02-27-22 Eval and manual PROM/ issue pendulum exercises     PATIENT EDUCATION: Education details: POC Explanation of findings issue HEP,  education on TPDN and precautions and aftercare with handout Person educated: Patient and  Child(ren) Education method: Explanation, Demonstration, Tactile cues, Verbal cues, and Handouts Education comprehension: verbalized understanding, returned demonstration, verbal cues required, tactile cues required, and needs further education     HOME EXERCISE PROGRAM: Access Code: [email or web address] URL: [email or web address] Date: 03/29/2022  updated HEP Prepared by: Voncille Lo  Exercises - Circular Shoulder Pendulum with Table Support  - 3 x daily - 7 x weekly - 2 sets - 10 reps - Flexion-Extension Shoulder Pendulum with Table Support  - 3 x daily - 7 x weekly - 2 sets - 10 reps - Horizontal Shoulder Pendulum with Table Support  - 3 x daily - 7 x weekly - 2 sets - 10 reps - Seated Scapular Retraction  - 1 x daily - 7 x weekly - 3 sets - 10 reps - Standing Shoulder and Trunk Flexion at Table  - 1 x daily - 7 x weekly - 1-2 sets - 10 reps - Seated Shoulder External Rotation AAROM with Dowel  - 1 x daily - 7 x weekly - 2 sets - 10 reps - 5 hold - Seated Shoulder Abduction Towel Slide at Table Top with Forearm in Neutral  - 1 x daily - 7 x weekly - 3 sets - 10 reps - Seated Bilateral Shoulder Flexion Towel Slide at Table Top  - 1 x daily - 7 x weekly - 3 sets - 10 reps - Clasped hands chest press and pullover  - 1 x daily - 7 x weekly - 1 sets - 10 reps - Isometric Shoulder Flexion at Wall  - 1 x daily - 7 x weekly - 1-2 sets - 10 reps - 5 hold - Standing Isometric Shoulder Internal Rotation at Doorway  - 1 x  daily - 7 x weekly - 1-2 sets - 10 reps - 5 hold - Standing Isometric Shoulder External Rotation with Doorway  - 1 x daily - 7 x weekly - 1-2 sets - 10 reps - 5 hold - Standing Isometric Shoulder Extension with Doorway - Arm Bent  - 1 x daily - 7 x weekly - 1-2 sets - 10 reps - 5 hold - Standing Shoulder Abduction Wall Slide with Thumb Out  - 1 x daily - 7 x weekly - 1-2 sets - 10 reps - Sidelying Shoulder ER with Towel and Dumbbell  - 1 x daily - 7 x weekly - 3 sets - 10  reps - Sidelying Shoulder Flexion 150 degrees  - 1 x daily - 7 x weekly - 3 sets - 10 reps - Sidelying Shoulder Horizontal Abduction  - 1 x daily - 7 x weekly - 3 sets - 10 reps - Sidelying Shoulder Abduction Palm Forward  - 1 x daily - 7 x weekly - 3 sets - 10 reps  Patient Education - Ice - Heat   ASSESSMENT:   CLINICAL IMPRESSION: Pt able to lift 20# dumbell today with CGA, 10 reps. Min change in MMT. Discussed purchase of 5# weight for home use. She was scheduled for re-evaluation with primary PT for potential extension of POC beyond this week.  She fatigues and reports min pain in lateral upper arm. Will Continue toward completion of goals.      OBJECTIVE IMPAIRMENTS decreased ROM, decreased strength, impaired UE functional use, postural dysfunction, and pain.    ACTIVITY LIMITATIONS carrying, lifting, sleeping, bathing, toileting, dressing, reach over head, and caring for others not being able to lift 30 # grandson   PARTICIPATION LIMITATIONS:  cannot lift grandson and do housework   PERSONAL FACTORS Asthma, DM, Hyperlipidemia HTN. Mild cognitive impairment  are also affecting patient's functional outcome.    REHAB POTENTIAL: Good   CLINICAL DECISION MAKING: Evolving/moderate complexity   EVALUATION COMPLEXITY: Moderate     GOALS: Goals reviewed with patient? Yes   SHORT TERM GOALS: Target date: 03/27/2022  (Remove Blue Hyperlink)   Pt will be independent with initial HEP Baseline:noknowledge Goal status: MET 03/09/22   2.  Pt will gain PROM in Left shld flexion to at least 140 degrees Baseline: eval 90 degrees PROM Status 03/22/22: 134 flexion PROM Status: 03/27/22 140 flexion PROM Goal status: MET   3.  Pt will be able to sleep throughout night for at least 4 hours without waking utilizing pillows and positioning for  left shld comform Baseline: wakes at least 3 times a night and cannot sleep on left side 03-29-22  6hours of sleep Status: 03/22/22: wakes 2 x per night  due to pain  Goal status: MET       LONG TERM GOALS: Target date: 04/24/2022  (Remove Blue Hyperlink)   Pt will be independent with advanced HEP Baseline: No knowledge 03-29-22 working on advanced HEP Goal status: ONGOING   2.  Pt will be able to hold grandson who is 30 # and carry without exacerbating pain in Left shoulder Baseline: Unable to lift any object or have any resistance at eval 04-10-22  Pt able to left 15 # BIL above head Goal status: ONGOING   3.  Pt will be able to sleep on Left side without waking during the night for at least 4-5 hours of uninterrupted sleep. Baseline: Pt wakes at least 3 time a night due to pain in left shld Karen Kays  to lie of left shld Status: 03/27/22 :pain wakes 2 x per night Goal status: ONGOING   4.  Pt will be able to perform household chores like sweeping the floor and cooking  with pain 2/10 or less Baseline: unable to perform any household chores,  04-10-22  Pt able to cook but does not sweep with Left hand.   Goal status: ONGOING   5.  Pt will be able to perform prayers in childs pose without exacerbating pain in arms Baseline: unable to pray in Muslim prayer posture due to arm pain and decreased AROM Goal status: MET 04-10-22   6.  FOTO will improve from 32%   to  57%     indicating improved functional mobility . Baseline: eval 32% Status: 03/27/22 (8th visit) : 54% Goal status: ONGOING   7. Pt will be able to lift dishes above head to put in cabinet in order to complete kitchen work.            Baseline:  unable to lift left UE 04-10-22  Pt able to lift BIL OH press with 15 #            GOAL status ONGOING     PLAN: PT FREQUENCY: 2x/week   PT DURATION: 8 weeks   PLANNED INTERVENTIONS: Therapeutic exercises, Therapeutic activity, Neuromuscular re-education, Patient/Family education, Joint mobilization, Dry Needling, Electrical stimulation, Cryotherapy, Moist heat, Taping, Ionotophoresis 2m/ml Dexamethasone, Manual therapy, and  Re-evaluation   PLAN FOR NEXT SESSION:   Work on sweeping and lifting above head.  Able to do 15 # easily.  PT assists with 20# but then was able to lift x 5 1/2 range with elbows bent  Work on progressing strength.   JHessie Diener PTA 04/17/22 8:29 AM Phone: [phone]: [phone]

## 2022-04-19 ENCOUNTER — Ambulatory Visit: Payer: 59 | Admitting: Physical Therapy

## 2022-04-19 ENCOUNTER — Encounter: Payer: Self-pay | Admitting: Physical Therapy

## 2022-04-19 DIAGNOSIS — M6281 Muscle weakness (generalized): Secondary | ICD-10-CM

## 2022-04-19 DIAGNOSIS — M25512 Pain in left shoulder: Secondary | ICD-10-CM | POA: Diagnosis not present

## 2022-04-19 NOTE — Therapy (Signed)
OUTPATIENT PHYSICAL THERAPY TREATMENT NOTE   Patient Name: Gabriella Norman MRN: [id number] DOB:[birth year], 66 y.o., female Today's Date: 04/19/2022 PCP: Scheryl Marten, [place]   REFERRING PROVIDER: Aundra Dubin, PA-C  END OF SESSION:   PT End of Session - 04/19/22 0719     Visit Number 14    Number of Visits 16    Date for PT Re-Evaluation 04/24/22    Authorization Type [employer]    Authorization - Number of Visits 30    PT Start Time 0715    PT Stop Time 0753    PT Time Calculation (min) 38 min                Past Medical History:  Diagnosis Date   Asthma    DM (diabetes mellitus) ([place])    Hyperlipidemia    Hypertension    Past Surgical History:  Procedure Laterality Date   SHOULDER ARTHROSCOPY WITH ROTATOR CUFF REPAIR AND SUBACROMIAL DECOMPRESSION Left 02/09/2022   Procedure: LEFT SHOULDER ARTHROSCOPY, EXTENSIVE DEBRIDEMENT, SUBACROMIAL DECOMPRESSION;  Surgeon: Leandrew Koyanagi, MD;  Location: [place];  Service: Orthopedics;  Laterality: Left;   TUBAL LIGATION     Patient Active Problem List   Diagnosis Date Noted   Superior labrum anterior-to-posterior (SLAP) tear of right shoulder 09/29/2021   Mild cognitive impairment 07/06/2021   Memory loss 04/03/2021   Pure hypercholesterolemia 04/23/2019   Type 2 diabetes mellitus without complication, without long-term current use of insulin ([place]) 04/23/2019   Benign essential HTN 04/17/2019   Cough variant asthma vs UACS 09/23/2018    REFERRING DIAG: Z98.890 (ICD-10-CM) - S/P arthroscopy of left shoulder   THERAPY DIAG:  Acute pain of left shoulder  Muscle weakness (generalized)  Rationale for Evaluation and Treatment Rehabilitation  PERTINENT HISTORY: Asthma, DM, Hyperlipidemia HTN. Mild cognitive impairment  Presently LEFT SHOULDER ARTHROSCOPY, EXTENSIVE DEBRIDEMENT, SUBACROMIAL DECOMPRESSION;  Superior labrum anterior-to-posterior (SLAP) tear of right shoulder   PRECAUTIONS:  Shoulder No lifting, no resistance over biceps tenotomy PROM initially   SUBJECTIVE: I am getting better. No pain today.   PAIN:  Are you having pain? Yes: NPRS scale: 0/10 Pain location: Left shoulder Pain description: sharp pain Aggravating factors: moving it.cant sleep on my arm, cannot pick up any items with arm Relieving factors: medication, utilizing pillow   OBJECTIVE: (objective measures completed at initial evaluation unless otherwise dated)   DIAGNOSTIC FINDINGS:  FINDINGS: 4-20-22There is no evidence of fracture or dislocation. There is no evidence of arthropathy or other focal bone abnormality. Soft tissues are unremarkable.   IMPRESSION: Negative.   PATIENT SURVEYS:  FOTO 32% eval  57% predicted FOTO status 03/27/22: 52% FOTO status 04-03-22 54%   COGNITION:           Overall cognitive status: Within functional limits for tasks assessed  mild cognitive impairment in history  with daughter for eval                                  SENSATION: WFL Light touch: Impaired  pt with some numbness into Left forearm    POSTURE: Forward head and rounded shoulders left more forward than Right   UPPER EXTREMITY ROM:    Active/ Passive ROM Right eval Left eval Left 03/01/22 Left 03/05/22 Left  03/09/22 Left 03/12/22 Left 03/22/22 Left 03/27/22 Left 03-27-22 Left_0 AROM 04/12/22  Shoulder flexion 160/P 165 50/P-90 P 100 P 125 A 95  P130 A117/ P134 P 140 P143 Pain A140  Shoulder extension 35            Shoulder abduction 157 50- 80 P 80 P90 A 55 P110 85  85 Pain A 135  Shoulder adduction              Shoulder internal rotation 90 P30       To L1 with pain L1 no pain  Shoulder external rotation 60 P 0 P45  P60  P80 @ 45    T 2 no pain  Elbow flexion              Elbow extension              Wrist flexion              Wrist extension              Wrist ulnar deviation              Wrist radial deviation              Wrist pronation              Wrist supination               (Blank rows = not tested)   UPPER EXTREMITY MMT:       Not tested due to post surgery Pt 5/5 on R   MMT Right eval Left eval Left 03-29-22 Left  04/17/22  Shoulder flexion     4- 4-  Shoulder extension     4-   Shoulder abduction     4- 4-  Shoulder adduction        Shoulder internal rotation     3+ 4  Shoulder external rotation     4- 4  Middle trapezius        Lower trapezius        Elbow flexion        Elbow extension        Wrist flexion        Wrist extension        Wrist ulnar deviation        Wrist radial deviation        Wrist pronation        Wrist supination        Grip strength (lbs)        (Blank rows = not tested)   SHOULDER SPECIAL TESTS:            NT due to recent shld arthroscopy biceps tenotomy and SAD   JOINT MOBILITY TESTING:  NT due to recent surgery   PALPATION:  TTP over incision and over Left biceps muscle belly             TODAY'S TREATMENT:  [facility] Adult PT Treatment:                                                DATE: 04-19-22 Therapeutic Exercise: UBE L2 2 min each way  BIL OH press with 20 #  1 x 10 BIl curl to OH press 20# 1 x 10 Star Pattern Green band x 10  Blue band tricep ext 10 x 3  Blue band bicep curl 10 x 3  Standing Blue band IR 10 x 3 5# [employer]  reach 10 x 3 top shelf R side lying ER  L  and towel and 4 lb weight  3 x 10 3 sec hold R side lying abduction  L 4 lb weight  3 x 10  R side lying flexion  L  4 lb weight  3 x 10      [facility] Adult PT Treatment:                                                DATE: 04-17-22 Therapeutic Exercise: UBE L2 2 min each way  BIL OH press with 20 #  1 x 10-(CGA on dumbell ) BIl curl to [facility] press 15# 1 x 10 Star Pattern Green band x 10  Green band tricep ext 10 x 3  Green band bicep curl 10 x 3  R side lying ER  L  and towel and 4 lb weight  3 x 10 3 sec hold R side lying abduction  L 3 lb weight  3 x 10 3 sec hold R side lying flexion  L  3 lb weight  3 x 10 3 sec hold Standing  Green band IR 10 x 3 4# OH Cabinet reach 10 x 3 top shelf    [facility] Adult PT Treatment:                                                DATE: 04-12-22 Therapeutic Exercise: UBE L1 2 min each way  R side lying ER  L  and towel and 3 lb weight  3 x 10 3 sec hold R side lying flexion  L  3 lb weight  3 x 10 3 sec hold R side lying abduction  L 3 lb weight  3 x 10 3 sec hold BIL OH press with 15 #  1 x 10 BIl curl to OH press 15# 1 x 10  Horizontal Abd 3 x 10 RTB Diagonals 3 x 10 GTB Needed TC and VC to perform correctly, left Green band tricep ext 10 x 3  Green band bicep curl 10 x 3    [facility] Adult PT Treatment:                                                DATE: 04-10-22 Therapeutic Exercise: R side lying ER  L with  2 lb DB and towel and 3 lb weight  3 x 10 3 sec hold R side lying flexion  L with 2 lb DB  and 3 lb weight  3 x 10 3 sec hold R side lying abduction  L with 2 lb DB  and 3 lb weight  3 x 10 3 sec hold BIL OH press with 15 #  2 x 10 Horizontal Abd 3 x 10 RTB Diagonals 3 x 10 RTB Needed TC and VC to perform correctly Left Bicept 5 lb  3 x 10 L sidelying with RTB eccentric ER  with shld 90 degree and Video on pt phone for use at home 3 x 10 with PT assist BIL OH press with  20 # with PT assist. Pt very tentative but then able to perform 2 x 5 with rest 1/2 range just above head.  Manual Therapy: STW of left deltoid and left biceps PROM shoulder flexion, abduction, IR , ER  Isometric at various angles for shld abd/ flexion Trigger Point Dry Needling Treatment: Pre-treatment instruction: Patient instructed on dry needling rationale, procedures, and possible side effects including pain during treatment (achy,cramping feeling), bruising, drop of blood, lightheadedness, nausea, sweating. Patient Consent Given: Yes Education handout provided: Yes Muscles treated: Left deltoid ant, middle  Needle size and number: .30x92m x 1  to use in multiple places over left deltoid   ant/middle Electrical stimulation performed: No Parameters: N/A Treatment response/outcome: Twitch response elicited and Palpable decrease in muscle tension Post-treatment instructions: Patient instructed to expect possible mild to moderate muscle soreness later today and/or tomorrow. Patient instructed in methods to reduce muscle soreness and to continue prescribed HEP. If patient was dry needled over the lung field, patient was instructed on signs and symptoms of pneumothorax and, however unlikely, to see immediate medical attention should they occur. Patient was also educated on signs and symptoms of infection and to seek medical attention should they occur. Patient verbalized understanding of these instructions and education.  Modalities - Moist hot pack to left deltoid   [facility]                                                DATE: 03-29-22 FOTO 10th visit 54% predicted 57% Therapeutic Exercise: UBE 2.30 min forward and then 2.30 min backward. VC for posture and chin tuck R side lying ER L with  2 lb DB and towel and 2 lb weight  3 x 10 3 sec hold R side lying flexion  L with 2 lb DB  and 2 lb weight  3 x 10 3 sec hold R side lying abduction L with 2 lb DB  and 2 lb weight  3 x 10 3 sec hold L sidelying with RTB eccentric ER  with shld 90 degree and Video on pt phone for use at home 3 x 10 with PT assist Horizontal abd RTB 3 x 10 in supine with hooklying.  VC for pt to slow down and 3 sec hold Diagonals  RTB 3 x 10 in supine with hooklying.  VC for pt to slow down and 3 sec hold Sitting with single Left OH press with 2 lb 2 x 10 Manual Therapy: PROM shoulder flexion, abduction, IR , ER  Isometric at various angles for shld abd/ flexion   [facility] Adult PT Treatment:                                                DATE: 03-29-22  Therapeutic Exercise: Flexion wall slides 10 x 2  Left shoulder abduction wall slides 10 x 2 -reaches over 130 degrees Standing cane flexion and  abduction x 10 each R side lying ER with  2 lb DB and towel and 2 lb weight  3 x 10 3 sec hold R side lying flexion with 2 lb DB  and 2 lb weight  3 x 10 3 sec hold R side lying abduction with 2  lb DB  and 2 lb weight  3 x 10 3 sec hold Red band IR, ER x 10 each  Shoulder row red  1 x 15 Shoulder ext bilat red x 15 Isometrics flexion 5 sec x 10 Supine chest press  Manual Therapy: PROM shoulder flexion, abduction, IR , ER     [facility] Adult PT Treatment:                                                DATE: 03/27/22 Therapeutic Exercise: Flexion wall slides 10 x 2  Left shoulder abduction wall slides 10 x 2 -reaches over 130 degrees Standing cane flexion and abduction x 10 each UBE level 1 x 3 min forward Pulleys elevation x 2 minutes ~ 130 deg Yellow band IR, ER x 10 each  Shoulder row red  1 x 15 Shoulder ext bilat red x 15 Isometrics flexion 5 sec x 10 Supine chest press Supine pullover with dowel Sidelying ER AROM x15 Sidelying abduction AROM x 6  Clasped hand assisted shoulder PROM for flexion to 140   Manual Therapy:  PROM shoulder flexion, abduction, IR , ER   [facility] Adult PT Treatment:                                                DATE: 03/22/22 Therapeutic Exercise: Flexion wall slides 10 x 2  Left shoulder abduction wall slides 10 x 2  Pulleys elevation x 2 minutes ~ 130 deg Shoulder row red x 15 Isometrics flexion, Er, abct, Ext, IR- updated HEP  Manual Therapy:  PROM shoulder flexion, abduction, IR , ER          PATIENT EDUCATION: Education details: POC Explanation of findings issue HEP,  education on TPDN and precautions and aftercare with handout Person educated: Patient and Child(ren) Education method: Explanation, Demonstration, Tactile cues, Verbal cues, and Handouts Education comprehension: verbalized understanding, returned demonstration, verbal cues required, tactile cues required, and needs further education     HOME EXERCISE PROGRAM: Access Code:  [email or web address] URL: [email or web address] Date: 03/29/2022  updated HEP Prepared by: Voncille Lo  Exercises - Circular Shoulder Pendulum with Table Support  - 3 x daily - 7 x weekly - 2 sets - 10 reps - Flexion-Extension Shoulder Pendulum with Table Support  - 3 x daily - 7 x weekly - 2 sets - 10 reps - Horizontal Shoulder Pendulum with Table Support  - 3 x daily - 7 x weekly - 2 sets - 10 reps - Seated Scapular Retraction  - 1 x daily - 7 x weekly - 3 sets - 10 reps - Standing Shoulder and Trunk Flexion at Table  - 1 x daily - 7 x weekly - 1-2 sets - 10 reps - Seated Shoulder External Rotation AAROM with Dowel  - 1 x daily - 7 x weekly - 2 sets - 10 reps - 5 hold - Seated Shoulder Abduction Towel Slide at Table Top with Forearm in Neutral  - 1 x daily - 7 x weekly - 3 sets - 10 reps - Seated Bilateral Shoulder Flexion Towel Slide at Table Top  - 1 x daily - 7 x weekly - 3 sets - 10 reps - Clasped hands chest  press and pullover  - 1 x daily - 7 x weekly - 1 sets - 10 reps - Isometric Shoulder Flexion at Wall  - 1 x daily - 7 x weekly - 1-2 sets - 10 reps - 5 hold - Standing Isometric Shoulder Internal Rotation at Doorway  - 1 x daily - 7 x weekly - 1-2 sets - 10 reps - 5 hold - Standing Isometric Shoulder External Rotation with Doorway  - 1 x daily - 7 x weekly - 1-2 sets - 10 reps - 5 hold - Standing Isometric Shoulder Extension with Doorway - Arm Bent  - 1 x daily - 7 x weekly - 1-2 sets - 10 reps - 5 hold - Standing Shoulder Abduction Wall Slide with Thumb Out  - 1 x daily - 7 x weekly - 1-2 sets - 10 reps - Sidelying Shoulder ER with Towel and Dumbbell  - 1 x daily - 7 x weekly - 3 sets - 10 reps - Sidelying Shoulder Flexion 150 degrees  - 1 x daily - 7 x weekly - 3 sets - 10 reps - Sidelying Shoulder Horizontal Abduction  - 1 x daily - 7 x weekly - 3 sets - 10 reps - Sidelying Shoulder Abduction Palm Forward  - 1 x daily - 7 x weekly - 3 sets - 10 reps  Patient Education -  Ice - Heat   ASSESSMENT:   CLINICAL IMPRESSION: Pt able to lift 20# dumbell today withoutCGA, 10 reps, 2 sets. Demonstrates increased strength and endurance today with therex. Able to increase load and pt reported less pain. She was scheduled for re-evaluation with primary PT for potential extension of POC beyond this week.  Will Continue toward completion of goals.      OBJECTIVE IMPAIRMENTS decreased ROM, decreased strength, impaired UE functional use, postural dysfunction, and pain.    ACTIVITY LIMITATIONS carrying, lifting, sleeping, bathing, toileting, dressing, reach over head, and caring for others not being able to lift 30 # grandson   PARTICIPATION LIMITATIONS:  cannot lift grandson and do housework   PERSONAL FACTORS Asthma, DM, Hyperlipidemia HTN. Mild cognitive impairment  are also affecting patient's functional outcome.    REHAB POTENTIAL: Good   CLINICAL DECISION MAKING: Evolving/moderate complexity   EVALUATION COMPLEXITY: Moderate     GOALS: Goals reviewed with patient? Yes   SHORT TERM GOALS: Target date: 03/27/2022  (Remove Blue Hyperlink)   Pt will be independent with initial HEP Baseline:noknowledge Goal status: MET 03/09/22   2.  Pt will gain PROM in Left shld flexion to at least 140 degrees Baseline: eval 90 degrees PROM Status 03/22/22: 134 flexion PROM Status: 03/27/22 140 flexion PROM Goal status: MET   3.  Pt will be able to sleep throughout night for at least 4 hours without waking utilizing pillows and positioning for  left shld comform Baseline: wakes at least 3 times a night and cannot sleep on left side 03-29-22  6hours of sleep Status: 03/22/22: wakes 2 x per night due to pain  Goal status: MET       LONG TERM GOALS: Target date: 04/24/2022  (Remove Blue Hyperlink)   Pt will be independent with advanced HEP Baseline: No knowledge 03-29-22 working on advanced HEP Goal status: ONGOING   2.  Pt will be able to hold grandson who is 30 # and carry  without exacerbating pain in Left shoulder Baseline: Unable to lift any object or have any resistance at eval 04-10-22  Pt able to left  15 # BIL above head Status 04/19/22: can lift 20# over head - no longer needs to pick up grandson Goal status: ONGOING   3.  Pt will be able to sleep on Left side without waking during the night for at least 4-5 hours of uninterrupted sleep. Baseline: Pt wakes at least 3 time a night due to pain in left shld /unable to lie of left shld Status: 03/27/22 :pain wakes 2 x per night Status: 04/19/22: no longer wakes up- but does not sleep on L Goal status: Partially MET   4.  Pt will be able to perform household chores like sweeping the floor and cooking  with pain 2/10 or less Baseline: unable to perform any household chores,   04-10-22  Pt able to cook but does not sweep with Left hand.   04/19/22: Has not tried sweeping Goal status: ONGOING   5.  Pt will be able to perform prayers in childs pose without exacerbating pain in arms Baseline: unable to pray in Muslim prayer posture due to arm pain and decreased AROM Goal status: MET 04-10-22   6.  FOTO will improve from 32%   to  57%     indicating improved functional mobility . Baseline: eval 32% Status: 03/27/22 (8th visit) : 54% Goal status: ONGOING   7. Pt will be able to lift dishes above head to put in cabinet in order to complete kitchen work.            Baseline:  unable to lift left UE 04-10-22  Pt able to lift BIL OH press with 15 #  Status: 04/19/22: has not tried            GOAL status ONGOING     PLAN: PT FREQUENCY: 2x/week   PT DURATION: 8 weeks   PLANNED INTERVENTIONS: Therapeutic exercises, Therapeutic activity, Neuromuscular re-education, Patient/Family education, Joint mobilization, Dry Needling, Electrical stimulation, Cryotherapy, Moist heat, Taping, Ionotophoresis 83m/ml Dexamethasone, Manual therapy, and Re-evaluation   PLAN FOR NEXT SESSION:   Work on sweeping and lifting above head.  Able  to do 20 # easily.   Work on progressing strength. FOTO, check LTGS, DC?    JHessie Diener PTA 04/19/22 7:59 AM Phone: [phone]: [phone]

## 2022-05-01 ENCOUNTER — Encounter: Payer: Self-pay | Admitting: Physical Therapy

## 2022-05-01 ENCOUNTER — Ambulatory Visit: Payer: 59 | Admitting: Physical Therapy

## 2022-05-01 DIAGNOSIS — M6281 Muscle weakness (generalized): Secondary | ICD-10-CM

## 2022-05-01 DIAGNOSIS — M25512 Pain in left shoulder: Secondary | ICD-10-CM | POA: Diagnosis not present

## 2022-05-01 NOTE — Therapy (Signed)
OUTPATIENT PHYSICAL THERAPY TREATMENT NOTE   Patient Name: Gabriella Norman MRN: [id number] DOB:12/31/1955, 66 y.o., female Today's Date: 05/01/2022 PCP: Scheryl Marten, [place]   REFERRING PROVIDER: Aundra Dubin, PA-C  END OF SESSION:   PT End of Session - 05/01/22 0855     Visit Number 15    Number of Visits 21    Date for PT Re-Evaluation 06/12/22    Authorization Type [employer]    Authorization - Number of Visits 30    PT Start Time [phone]    PT Stop Time 0933    PT Time Calculation (min) 38 min    Activity Tolerance Patient tolerated treatment well    Behavior During Therapy WFL for tasks assessed/performed                 Past Medical History:  Diagnosis Date   Asthma    DM (diabetes mellitus) ([place])    Hyperlipidemia    Hypertension    Past Surgical History:  Procedure Laterality Date   SHOULDER ARTHROSCOPY WITH ROTATOR CUFF REPAIR AND SUBACROMIAL DECOMPRESSION Left 02/09/2022   Procedure: LEFT SHOULDER ARTHROSCOPY, EXTENSIVE DEBRIDEMENT, SUBACROMIAL DECOMPRESSION;  Surgeon: Leandrew Koyanagi, MD;  Location: [facility];  Service: Orthopedics;  Laterality: Left;   TUBAL LIGATION     Patient Active Problem List   Diagnosis Date Noted   Superior labrum anterior-to-posterior (SLAP) tear of right shoulder 09/29/2021   Mild cognitive impairment 07/06/2021   Memory loss 04/03/2021   Pure hypercholesterolemia 04/23/2019   Type 2 diabetes mellitus without complication, without long-term current use of insulin ([place]) 04/23/2019   Benign essential HTN 04/17/2019   Cough variant asthma vs UACS 09/23/2018    REFERRING DIAG: Z98.890 (ICD-10-CM) - S/P arthroscopy of left shoulder   THERAPY DIAG:  Acute pain of left shoulder - Plan: PT plan of care cert/re-cert  Muscle weakness (generalized) - Plan: PT plan of care cert/re-cert  Rationale for Evaluation and Treatment Rehabilitation  PERTINENT HISTORY: Asthma, DM, Hyperlipidemia HTN. Mild  cognitive impairment  Presently LEFT SHOULDER ARTHROSCOPY, EXTENSIVE DEBRIDEMENT, SUBACROMIAL DECOMPRESSION;  Superior labrum anterior-to-posterior (SLAP) tear of right shoulder   PRECAUTIONS: Shoulder No lifting, no resistance over biceps tenotomy PROM initially   SUBJECTIVE: I am getting better. I am a 2/10 pain in my left shoulder  PAIN:  Are you having pain? Yes: NPRS scale: 2/10 Pain location: Left shoulder Pain description: sharp pain Aggravating factors: moving it.cant sleep on my arm, cannot pick up any items with arm Relieving factors: medication, utilizing pillow   OBJECTIVE: (objective measures completed at initial evaluation unless otherwise dated)   DIAGNOSTIC FINDINGS:  FINDINGS: 4-20-22There is no evidence of fracture or dislocation. There is no evidence of arthropathy or other focal bone abnormality. Soft tissues are unremarkable.   IMPRESSION: Negative.   PATIENT SURVEYS:  FOTO 32% eval  57% predicted FOTO status 03/27/22: 52% FOTO status 04-03-22 54% FOTO status 05-01-22  72%   COGNITION:           Overall cognitive status: Within functional limits for tasks assessed  mild cognitive impairment in history  with daughter for eval                                  SENSATION: WFL Light touch: Impaired  pt with some numbness into Left forearm    POSTURE: Forward head and rounded shoulders left more forward than Right  UPPER EXTREMITY ROM:    Active/ Passive ROM Right eval Left eval Left 03/01/22 Left 03/05/22 Left  03/09/22 Left 03/12/22 Left 03/22/22 Left 03/27/22 Left 03-27-22 Left\ AROM 04/12/22 Left AROM  Shoulder flexion 160/P 165 50/P-90 P 100 P 125 A 95 P130 A117/ P134 P 140 P143 Pain A140 A158/P164 min pain  Shoulder extension 35             Shoulder abduction 157 50- 80 P 80 P90 A 55 P110 85  85 Pain A 135 A147/P153  Shoulder adduction               Shoulder internal rotation 90 P30       To L1 with pain L1 no pain T-12 no pain  Shoulder  external rotation 60 P 0 P45  P60  P80 @ 45    T 2 no pain T -2 no pain  Elbow flexion               Elbow extension               Wrist flexion               Wrist extension               Wrist ulnar deviation               Wrist radial deviation               Wrist pronation               Wrist supination               (Blank rows = not tested)   UPPER EXTREMITY MMT:       Not tested due to post surgery Pt 5/5 on R   MMT Right eval Left eval Left 03-29-22 Left  04/17/22 Left 05-01-22  Shoulder flexion     4- 4- 4-  Shoulder extension     4-    Shoulder abduction     4- 4- 4-  Shoulder adduction         Shoulder internal rotation     3+ 4 4  Shoulder external rotation     4- 4 4  Middle trapezius         Lower trapezius         Elbow flexion         Elbow extension         Wrist flexion         Wrist extension         Wrist ulnar deviation         Wrist radial deviation         Wrist pronation         Wrist supination         Grip strength (lbs)         (Blank rows = not tested)   SHOULDER SPECIAL TESTS:            NT due to recent shld arthroscopy biceps tenotomy and SAD   JOINT MOBILITY TESTING:  NT due to recent surgery   PALPATION:  TTP over incision and over Left biceps muscle belly             TODAY'S TREATMENT:  [facility] Adult PT Treatment:  DATE: 05-01-22 FOTO status 05-01-22  72% R eval Therapeutic Exercise: Shoulder Flexion Wall Slide with Towel  3 x 10 L only Standing Shoulder Abduction Wall Slide with Thumb Out 2 x 10 left only Seated Elbow Flexion with Dumbbells  5 # 3 x 10 Standing Shoulder Flexion with GTB 3 x 10 L only Standing Single Arm Shoulder Abduction  with GTB L only Shoulder External Rotation and Scapular Retraction  with GTB 3 x 10 BIL Scaption with GTB  3 x 10 reps 20# DB with both arms lift to shld height with compensations.   [facility] Adult PT Treatment:                                                 DATE: 04-19-22 Therapeutic Exercise: UBE L2 2 min each way  BIL OH press with 20 #  1 x 10 BIl curl to OH press 20# 1 x 10 Star Pattern Green band x 10  Blue band tricep ext 10 x 3  Blue band bicep curl 10 x 3  Standing Blue band IR 10 x 3 5# OH Cabinet reach 10 x 3 top shelf R side lying ER  L  and towel and 4 lb weight  3 x 10 3 sec hold R side lying abduction  L 4 lb weight  3 x 10  R side lying flexion  L  4 lb weight  3 x 10      [facility] Adult PT Treatment:                                                DATE: 04-17-22 Therapeutic Exercise: UBE L2 2 min each way  BIL OH press with 20 #  1 x 10-(CGA on dumbell ) BIl curl to OH press 15# 1 x 10 Star Pattern Green band x 10  Green band tricep ext 10 x 3  Green band bicep curl 10 x 3  R side lying ER  L  and towel and 4 lb weight  3 x 10 3 sec hold R side lying abduction  L 3 lb weight  3 x 10 3 sec hold R side lying flexion  L  3 lb weight  3 x 10 3 sec hold Standing Green band IR 10 x 3 4# OH Cabinet reach 10 x 3 top shelf    [facility] Adult PT Treatment:                                                DATE: 04-12-22 Therapeutic Exercise: UBE L1 2 min each way  R side lying ER  L  and towel and 3 lb weight  3 x 10 3 sec hold R side lying flexion  L  3 lb weight  3 x 10 3 sec hold R side lying abduction  L 3 lb weight  3 x 10 3 sec hold BIL OH press with 15 #  1 x 10 BIl curl to OH press 15# 1 x 10  Horizontal Abd 3 x 10 RTB Diagonals 3 x 10 GTB  Needed TC and VC to perform correctly, left Green band tricep ext 10 x 3  Green band bicep curl 10 x 3    [facility] Adult PT Treatment:                                                DATE: 04-10-22 Therapeutic Exercise: R side lying ER  L with  2 lb DB and towel and 3 lb weight  3 x 10 3 sec hold R side lying flexion  L with 2 lb DB  and 3 lb weight  3 x 10 3 sec hold R side lying abduction  L with 2 lb DB  and 3 lb weight  3 x 10 3 sec hold BIL OH press with 15 #  2 x 10 Horizontal  Abd 3 x 10 RTB Diagonals 3 x 10 RTB Needed TC and VC to perform correctly Left Bicept 5 lb  3 x 10 L sidelying with RTB eccentric ER  with shld 90 degree and Video on pt phone for use at home 3 x 10 with PT assist BIL OH press with 20 # with PT assist. Pt very tentative but then able to perform 2 x 5 with rest 1/2 range just above head.  Manual Therapy: STW of left deltoid and left biceps PROM shoulder flexion, abduction, IR , ER  Isometric at various angles for shld abd/ flexion Trigger Point Dry Needling Treatment: Pre-treatment instruction: Patient instructed on dry needling rationale, procedures, and possible side effects including pain during treatment (achy,cramping feeling), bruising, drop of blood, lightheadedness, nausea, sweating. Patient Consent Given: Yes Education handout provided: Yes Muscles treated: Left deltoid ant, middle  Needle size and number: .30x42m x 1  to use in multiple places over left deltoid  ant/middle Electrical stimulation performed: No Parameters: N/A Treatment response/outcome: Twitch response elicited and Palpable decrease in muscle tension Post-treatment instructions: Patient instructed to expect possible mild to moderate muscle soreness later today and/or tomorrow. Patient instructed in methods to reduce muscle soreness and to continue prescribed HEP. If patient was dry needled over the lung field, patient was instructed on signs and symptoms of pneumothorax and, however unlikely, to see immediate medical attention should they occur. Patient was also educated on signs and symptoms of infection and to seek medical attention should they occur. Patient verbalized understanding of these instructions and education.  Modalities - Moist hot pack to left deltoid          PATIENT EDUCATION: Education details: POC Explanation of findings issue HEP,  education on TPDN and precautions and aftercare with handout Person educated: Patient and Child(ren) Education  method: Explanation, Demonstration, Tactile cues, Verbal cues, and Handouts Education comprehension: verbalized understanding, returned demonstration, verbal cues required, tactile cues required, and needs further education     HOME EXERCISE PROGRAM: Access Code: [email or web address] modified 05-01-22 URL: [email or web address] Date: 05/01/2022 Prepared by: LVoncille Lo Exercises - Sidelying Shoulder ER with Towel and Dumbbell  - 1 x daily - 7 x weekly - 3 sets - 10 reps - Sidelying Shoulder Flexion 150 degrees  - 1 x daily - 7 x weekly - 3 sets - 10 reps - Sidelying Shoulder Abduction Palm Forward  - 1 x daily - 7 x weekly - 3 sets - 10 reps - Sidelying Shoulder Horizontal Abduction  - 1 x daily - 7  x weekly - 3 sets - 10 reps - Shoulder Flexion Wall Slide with Towel  - 1 x daily - 7 x weekly - 3 sets - 10 reps - Standing Shoulder Abduction Wall Slide with Thumb Out  - 1 x daily - 7 x weekly - 1-2 sets - 10 reps - Seated Elbow Flexion with Dumbbells  - 1 x daily - 7 x weekly - 3 sets - 10 reps - Standing Shoulder Flexion with Resistance  - 1 x daily - 7 x weekly - 3 sets - 10 reps - Standing Single Arm Shoulder Abduction with Resistance  - 1 x daily - 7 x weekly - 3 sets - 10 reps - Shoulder External Rotation and Scapular Retraction with Resistance  - 1 x daily - 7 x weekly - 3 sets - 10 reps - Scaption with Resistance  - 1 x daily - 7 x weekly - 3 sets - 10 reps  Patient Education - Ice - Heat   ASSESSMENT:   CLINICAL IMPRESSION:  Pt able to lift 20# dumbell today with BIL arms but unable to lift 5 # DB with Left arm alone above head.  Pt has made tremendous progress but still have functional weakness in that she cannot pick up 30# grandson and cannot lift heavy kitchen pans to do work.  Pt can now actively use arm with AROM WFL limits but would benefit from continued PT to maximize strength in Left UE. FOTO status 05-01-22  72%. Pt has purchased 5 # weights to use at home but  unable to lift 5 lb at 90 degrees flexion with arm straight today.   LTG all Met except # 1, # 2 and # 4 which should be accomplished with progressive strengthening. Should be able to complete goals in 4-6 additional visits.        OBJECTIVE IMPAIRMENTS decreased ROM, decreased strength, impaired UE functional use, postural dysfunction, and pain.    ACTIVITY LIMITATIONS carrying, lifting, sleeping, bathing, toileting, dressing, reach over head, and caring for others not being able to lift 30 # grandson   PARTICIPATION LIMITATIONS:  cannot lift grandson and do housework   PERSONAL FACTORS Asthma, DM, Hyperlipidemia HTN. Mild cognitive impairment  are also affecting patient's functional outcome.    REHAB POTENTIAL: Good   CLINICAL DECISION MAKING: Evolving/moderate complexity   EVALUATION COMPLEXITY: Moderate     GOALS: Goals reviewed with patient? Yes   SHORT TERM GOALS: Target date: 03/27/2022  (Remove Blue Hyperlink)   Pt will be independent with initial HEP Baseline:noknowledge Goal status: MET 03/09/22   2.  Pt will gain PROM in Left shld flexion to at least 140 degrees Baseline: eval 90 degrees PROM Status 03/22/22: 134 flexion PROM Status: 03/27/22 140 flexion PROM Goal status: MET   3.  Pt will be able to sleep throughout night for at least 4 hours without waking utilizing pillows and positioning for  left shld comform Baseline: wakes at least 3 times a night and cannot sleep on left side 03-29-22  6hours of sleep Status: 03/22/22: wakes 2 x per night due to pain  Goal status: MET       LONG TERM GOALS: Target date: 04/24/2022   Revised  for target date 06-12-22   Pt will be independent with advanced HEP Baseline: No knowledge 03-29-22 working on advanced HEP.05-01-22  Pt compensates with elevated upper trap to carry greater than 5 lb and not full AROM Goal status: ONGOING   2.  Pt will  be able to hold grandson who is 30 # and carry without exacerbating pain in Left  shoulder Baseline: Unable to lift any object or have any resistance at eval 04-10-22  Pt able to left 15 # BIL above head Status 04/19/22: can lift 20# over head - no longer needs to pick up grandson 05-01-22  unable to pick up grandson but can carry if someone picks him up for her Goal status: ONGOING   3.  Pt will be able to sleep on Left side without waking during the night for at least 4-5 hours of uninterrupted sleep. Baseline: Pt wakes at least 3 time a night due to pain in left shld /unable to lie of left shld Status: 03/27/22 :pain wakes 2 x per night Status: 04/19/22: no longer wakes up- but does not sleep on L Goal status: Partially MET   4.  Pt will be able to perform household chores like sweeping the floor and cooking  with pain 2/10 or less Baseline: unable to perform any household chores,   04-10-22  Pt able to cook but does not sweep with Left hand.   04/19/22: Has not tried sweeping 05-01-22 tried sweeping but  still difficult picking up heavy cooking pans Goal status: ONGOING   5.  Pt will be able to perform prayers in childs pose without exacerbating pain in arms Baseline: unable to pray in Muslim prayer posture due to arm pain and decreased AROM Goal status: MET 04-10-22   6.  FOTO will improve from 32%   to  57%     indicating improved functional mobility . Baseline: eval 32% Status: 03/27/22 (8th visit) : 54% Status 05-01-22  72% Goal status: MET   7. Pt will be able to lift dishes above head to put in cabinet in order to complete kitchen work.            Baseline:  unable to lift left UE 04-10-22  Pt able to lift BIL OH press with 15 #  Status: 04/19/22: has not tried  Status 05-01-22 must use both hands to place 5 lb in shelf at shoulder height            GOAL status ONGOING     PLAN: PT FREQUENCY: 2x/week   PT DURATION: 8 weeks   PLANNED INTERVENTIONS: Therapeutic exercises, Therapeutic activity, Neuromuscular re-education, Patient/Family education, Joint mobilization,  Dry Needling, Electrical stimulation, Cryotherapy, Moist heat, Taping, Ionotophoresis 31m/ml Dexamethasone, Manual therapy, and Re-evaluation   PLAN FOR NEXT SESSION:   Work on sweeping and lifting above head.  Able to do 20 #  with BIL arms to 90 degree shld flexion but cannot lift 5 lb with long lever arm wit Left arm only. Work on progressing strength so pt can lift 30# grandson, do housework including lifting heavy pots and pans and sweeping  Check all possible CPT codes: [place]- PT Re-evaluation, 97110- Therapeutic Exercise, [id number] Neuro Re-education, 97140 - Manual Therapy, 97530 - Therapeutic Activities, 97535 - Self Care, [id number]- Electrical stimulation (Manual), [id number]- Iontophoresis, and 97750 - Physical performance training     If treatment provided at initial evaluation, no treatment charged due to lack of authorization.      LVoncille Lo PT, ASkylineCertified Exercise Expert for the Aging Adult  05/01/22 10:31 AM Phone: [phone]: [phone]

## 2022-05-03 ENCOUNTER — Ambulatory Visit: Payer: 59 | Admitting: Physical Therapy

## 2022-05-07 ENCOUNTER — Ambulatory Visit: Payer: 59 | Admitting: Physical Therapy

## 2022-05-14 ENCOUNTER — Ambulatory Visit: Payer: 59 | Admitting: Physical Therapy

## 2022-05-14 ENCOUNTER — Encounter: Payer: Self-pay | Admitting: Physical Therapy

## 2022-05-14 DIAGNOSIS — M25512 Pain in left shoulder: Secondary | ICD-10-CM

## 2022-05-14 DIAGNOSIS — M6281 Muscle weakness (generalized): Secondary | ICD-10-CM

## 2022-05-14 NOTE — Therapy (Addendum)
OUTPATIENT PHYSICAL THERAPY TREATMENT NOTE/DISCHARGE NOTE PHYSICAL THERAPY DISCHARGE SUMMARY  Visits from Start of Care: 16 Pt entered 07-05-22 and said she did not need any PT or re evaluation. She has no pain and doing all chores without pain.  Pt cancelled appt.  No charge visit for 07-05-22 Current functional level related to goals / functional outcomes: Pt not seen since 05-14-22 due to travelling in  [place]   Remaining deficits: None according to pt report   Education / Equipment: HEP/T band  Pt now has weights  to work with at home   Patient agrees to discharge. Patient goals were met. Patient is being discharged due to being pleased with the current functional level. And does not require further PT   Patient Name: Gabriella Norman MRN: [id number] DOB:05-Sep-1956, 66 y.o., female Today's Date: 05/14/2022 PCP: Scheryl Marten, [place]   REFERRING PROVIDER: Aundra Dubin, PA-C  END OF SESSION:   PT End of Session - 05/14/22 0803     Visit Number 16    Number of Visits 21    Date for PT Re-Evaluation 06/12/22    Authorization Type [employer]    Authorization - Number of Visits 30    PT Start Time 0804    PT Stop Time 0845    PT Time Calculation (min) 41 min                 Past Medical History:  Diagnosis Date   Asthma    DM (diabetes mellitus) ([place])    Hyperlipidemia    Hypertension    Past Surgical History:  Procedure Laterality Date   SHOULDER ARTHROSCOPY WITH ROTATOR CUFF REPAIR AND SUBACROMIAL DECOMPRESSION Left 02/09/2022   Procedure: LEFT SHOULDER ARTHROSCOPY, EXTENSIVE DEBRIDEMENT, SUBACROMIAL DECOMPRESSION;  Surgeon: Leandrew Koyanagi, MD;  Location: [facility];  Service: Orthopedics;  Laterality: Left;   TUBAL LIGATION     Patient Active Problem List   Diagnosis Date Noted   Superior labrum anterior-to-posterior (SLAP) tear of right shoulder 09/29/2021   Mild cognitive impairment 07/06/2021   Memory loss 04/03/2021   Pure  hypercholesterolemia 04/23/2019   Type 2 diabetes mellitus without complication, without long-term current use of insulin ([place]) 04/23/2019   Benign essential HTN 04/17/2019   Cough variant asthma vs UACS 09/23/2018    REFERRING DIAG: Z98.890 (ICD-10-CM) - S/P arthroscopy of left shoulder   THERAPY DIAG:  Acute pain of left shoulder  Muscle weakness (generalized)  Rationale for Evaluation and Treatment Rehabilitation  PERTINENT HISTORY: Asthma, DM, Hyperlipidemia HTN. Mild cognitive impairment  Presently LEFT SHOULDER ARTHROSCOPY, EXTENSIVE DEBRIDEMENT, SUBACROMIAL DECOMPRESSION;  Superior labrum anterior-to-posterior (SLAP) tear of right shoulder   PRECAUTIONS: Shoulder No lifting, no resistance over biceps tenotomy PROM initially   SUBJECTIVE: Sweeping is a little better. I always have some pain in the top of my shoulder.   PAIN:  Are you having pain? Yes: NPRS scale: 3/10 Pain location: Left shoulder Pain description: sharp pain Aggravating factors: moving it.cant sleep on my arm, cannot pick up any items with arm Relieving factors: medication, utilizing pillow   OBJECTIVE: (objective measures completed at initial evaluation unless otherwise dated)   DIAGNOSTIC FINDINGS:  FINDINGS: 4-20-22There is no evidence of fracture or dislocation. There is no evidence of arthropathy or other focal bone abnormality. Soft tissues are unremarkable.   IMPRESSION: Negative.   PATIENT SURVEYS:  FOTO 32% eval  57% predicted FOTO status 03/27/22: 52% FOTO status 04-03-22 54% FOTO status 05-01-22  72%  COGNITION:           Overall cognitive status: Within functional limits for tasks assessed  mild cognitive impairment in history  with daughter for eval                                  SENSATION: WFL Light touch: Impaired  pt with some numbness into Left forearm    POSTURE: Forward head and rounded shoulders left more forward than Right   UPPER EXTREMITY ROM:    Active/ Passive  ROM Right eval Left eval Left 03/01/22 Left 03/05/22 Left  03/09/22 Left 03/12/22 Left 03/22/22 Left 03/27/22 Left 03-27-22 Left\ AROM 04/12/22 Left AROM 05/01/22 Left AROM  Shoulder flexion 160/P 165 50/P-90 P 100 P 125 A 95 P130 A117/ P134 P 140 P143 Pain A140 A158/P164 min pain A 155, min pain on lower  Shoulder extension 35              Shoulder abduction 157 50- 80 P 80 P90 A 55 P110 85  85 Pain A 135 A147/P153   Shoulder adduction                Shoulder internal rotation 90 P30       To L1 with pain L1 no pain T-12 no pain   Shoulder external rotation 60 P 0 P45  P60  P80 @ 45    T 2 no pain T -2 no pain   Elbow flexion                Elbow extension                Wrist flexion                Wrist extension                Wrist ulnar deviation                Wrist radial deviation                Wrist pronation                Wrist supination                (Blank rows = not tested)   UPPER EXTREMITY MMT:       Not tested due to post surgery Pt 5/5 on R   MMT Right eval Left eval Left 03-29-22 Left  04/17/22 Left 05-01-22  Shoulder flexion     4- 4- 4-  Shoulder extension     4-    Shoulder abduction     4- 4- 4-  Shoulder adduction         Shoulder internal rotation     3+ 4 4  Shoulder external rotation     4- 4 4  Middle trapezius         Lower trapezius         Elbow flexion         Elbow extension         Wrist flexion         Wrist extension         Wrist ulnar deviation         Wrist radial deviation         Wrist pronation         Wrist supination  Grip strength (lbs)         (Blank rows = not tested)   SHOULDER SPECIAL TESTS:            NT due to recent shld arthroscopy biceps tenotomy and SAD   JOINT MOBILITY TESTING:  NT due to recent surgery   PALPATION:  TTP over incision and over Left biceps muscle belly             TODAY'S TREATMENT:  [facility] Adult PT Treatment:                                                DATE: 05/14/22 Therapeutic  Exercise: UBE level 2, 3 min each way Standing forward raise 3# 10 x 3  Standing lateral raise 3# 10 x 3  BIL OH press with 20 #  1 x 10 Bicep curl 6# 10 x 3  Green band ER bilat 10 x 3  Cabinet reach middle shelf 10 x 3 5# Blue band row 10 x 3  Green band extension 10 x 3     [facility] Adult PT Treatment:                                                DATE: 05-01-22 FOTO status 05-01-22  72% R eval Therapeutic Exercise: Shoulder Flexion Wall Slide with Towel  3 x 10 L only Standing Shoulder Abduction Wall Slide with Thumb Out 2 x 10 left only Seated Elbow Flexion with Dumbbells  5 # 3 x 10 Standing Shoulder Flexion with GTB 3 x 10 L only Standing Single Arm Shoulder Abduction  with GTB L only Shoulder External Rotation and Scapular Retraction  with GTB 3 x 10 BIL Scaption with GTB  3 x 10 reps 20# DB with both arms lift to shld height with compensations.   [facility] Adult PT Treatment:                                                DATE: 04-19-22 Therapeutic Exercise: UBE L2 2 min each way  BIL OH press with 20 #  1 x 10 BIl curl to OH press 20# 1 x 10 Star Pattern Green band x 10  Blue band tricep ext 10 x 3  Blue band bicep curl 10 x 3  Standing Blue band IR 10 x 3 5# OH Cabinet reach 10 x 3 top shelf R side lying ER  L  and towel and 4 lb weight  3 x 10 3 sec hold R side lying abduction  L 4 lb weight  3 x 10  R side lying flexion  L  4 lb weight  3 x 10      [facility] Adult PT Treatment:                                                DATE: 04-17-22 Therapeutic Exercise: UBE L2 2 min each way  BIL OH press  with 20 #  1 x 10-(CGA on dumbell ) BIl curl to [facility] press 15# 1 x 10 Star Pattern Green band x 10  Green band tricep ext 10 x 3  Green band bicep curl 10 x 3  R side lying ER  L  and towel and 4 lb weight  3 x 10 3 sec hold R side lying abduction  L 3 lb weight  3 x 10 3 sec hold R side lying flexion  L  3 lb weight  3 x 10 3 sec hold Standing Green band IR 10 x 3 4# OH  Cabinet reach 10 x 3 top shelf    [facility] Adult PT Treatment:                                                DATE: 04-12-22 Therapeutic Exercise: UBE L1 2 min each way  R side lying ER  L  and towel and 3 lb weight  3 x 10 3 sec hold R side lying flexion  L  3 lb weight  3 x 10 3 sec hold R side lying abduction  L 3 lb weight  3 x 10 3 sec hold BIL OH press with 15 #  1 x 10 BIl curl to OH press 15# 1 x 10  Horizontal Abd 3 x 10 RTB Diagonals 3 x 10 GTB Needed TC and VC to perform correctly, left Green band tricep ext 10 x 3  Green band bicep curl 10 x 3              PATIENT EDUCATION: Education details: POC Explanation of findings issue HEP,  education on TPDN and precautions and aftercare with handout Person educated: Patient and Child(ren) Education method: Explanation, Demonstration, Tactile cues, Verbal cues, and Handouts Education comprehension: verbalized understanding, returned demonstration, verbal cues required, tactile cues required, and needs further education     HOME EXERCISE PROGRAM: Access Code: [email or web address]  modified 05-01-22 URL: [email or web address] Date: 05/01/2022 Prepared by: Voncille Lo  Exercises - Sidelying Shoulder ER with Towel and Dumbbell  - 1 x daily - 7 x weekly - 3 sets - 10 reps - Sidelying Shoulder Flexion 150 degrees  - 1 x daily - 7 x weekly - 3 sets - 10 reps - Sidelying Shoulder Abduction Palm Forward  - 1 x daily - 7 x weekly - 3 sets - 10 reps - Sidelying Shoulder Horizontal Abduction  - 1 x daily - 7 x weekly - 3 sets - 10 reps - Shoulder Flexion Wall Slide with Towel  - 1 x daily - 7 x weekly - 3 sets - 10 reps - Standing Shoulder Abduction Wall Slide with Thumb Out  - 1 x daily - 7 x weekly - 1-2 sets - 10 reps - Seated Elbow Flexion with Dumbbells  - 1 x daily - 7 x weekly - 3 sets - 10 reps - Standing Shoulder Flexion with Resistance  - 1 x daily - 7 x weekly - 3 sets - 10 reps - Standing Single Arm Shoulder  Abduction with Resistance  - 1 x daily - 7 x weekly - 3 sets - 10 reps - Shoulder External Rotation and Scapular Retraction with Resistance  - 1 x daily - 7 x weekly - 3 sets - 10 reps - Scaption with Resistance  - 1 x  daily - 7 x weekly - 3 sets - 10 reps  Patient Education - Ice - Heat   ASSESSMENT:   CLINICAL IMPRESSION: Pt reports sweeping is a little better/easier and carrying heavy kitchen pots is easier as well. She sometimes has difficulty if pots are full. Continued with focused strengthening of left shoulder today. She has resting pain at 3/10 and tolerates strengthening with min increase in pain to 5/10. She is progressing well toward remaining goals reporting improvement in sweeping and carrying pots. Continued difficulty with long lever shoulder flexion, especially eccentric.      OBJECTIVE IMPAIRMENTS decreased ROM, decreased strength, impaired UE functional use, postural dysfunction, and pain.    ACTIVITY LIMITATIONS carrying, lifting, sleeping, bathing, toileting, dressing, reach over head, and caring for others not being able to lift 30 # grandson   PARTICIPATION LIMITATIONS:  cannot lift grandson and do housework   PERSONAL FACTORS Asthma, DM, Hyperlipidemia HTN. Mild cognitive impairment  are also affecting patient's functional outcome.    REHAB POTENTIAL: Good   CLINICAL DECISION MAKING: Evolving/moderate complexity   EVALUATION COMPLEXITY: Moderate     GOALS: Goals reviewed with patient? Yes   SHORT TERM GOALS: Target date: 03/27/2022  (Remove Blue Hyperlink)   Pt will be independent with initial HEP Baseline:noknowledge Goal status: MET 03/09/22   2.  Pt will gain PROM in Left shld flexion to at least 140 degrees Baseline: eval 90 degrees PROM Status 03/22/22: 134 flexion PROM Status: 03/27/22 140 flexion PROM Goal status: MET   3.  Pt will be able to sleep throughout night for at least 4 hours without waking utilizing pillows and positioning for  left  shld comform Baseline: wakes at least 3 times a night and cannot sleep on left side 03-29-22  6hours of sleep Status: 03/22/22: wakes 2 x per night due to pain  Goal status: MET       LONG TERM GOALS: Target date: 04/24/2022   Revised  for target date 06-12-22   Pt will be independent with advanced HEP Baseline: No knowledge 03-29-22 working on advanced HEP.05-01-22  Pt compensates with elevated upper trap to carry greater than 5 lb and not full AROM Goal status: ONGOING   2.  Pt will be able to hold grandson who is 30 # and carry without exacerbating pain in Left shoulder Baseline: Unable to lift any object or have any resistance at eval 04-10-22  Pt able to left 15 # BIL above head Status 04/19/22: can lift 20# over head - no longer needs to pick up grandson 05-01-22  unable to pick up grandson but can carry if someone picks him up for her Goal status: ONGOING   3.  Pt will be able to sleep on Left side without waking during the night for at least 4-5 hours of uninterrupted sleep. Baseline: Pt wakes at least 3 time a night due to pain in left shld /unable to lie of left shld Status: 03/27/22 :pain wakes 2 x per night Status: 04/19/22: no longer wakes up- but does not sleep on L Goal status: Partially MET   4.  Pt will be able to perform household chores like sweeping the floor and cooking  with pain 2/10 or less Baseline: unable to perform any household chores,   04-10-22  Pt able to cook but does not sweep with Left hand.   04/19/22: Has not tried sweeping 05-01-22 tried sweeping but  still difficult picking up heavy cooking pans Goal status: ONGOING  5.  Pt will be able to perform prayers in childs pose without exacerbating pain in arms Baseline: unable to pray in Muslim prayer posture due to arm pain and decreased AROM Goal status: MET 04-10-22   6.  FOTO will improve from 32%   to  57%     indicating improved functional mobility . Baseline: eval 32% Status: 03/27/22 (8th visit) : 54% Status  05-01-22  72% Goal status: MET   7. Pt will be able to lift dishes above head to put in cabinet in order to complete kitchen work.            Baseline:  unable to lift left UE 04-10-22  Pt able to lift BIL OH press with 15 #  Status: 04/19/22: has not tried  Status 05-01-22 must use both hands to place 5 lb in shelf at shoulder height            GOAL status ONGOING     PLAN: PT FREQUENCY: 2x/week   PT DURATION: 8 weeks   PLANNED INTERVENTIONS: Therapeutic exercises, Therapeutic activity, Neuromuscular re-education, Patient/Family education, Joint mobilization, Dry Needling, Electrical stimulation, Cryotherapy, Moist heat, Taping, Ionotophoresis 32m/ml Dexamethasone, Manual therapy, and Re-evaluation   PLAN FOR NEXT SESSION:   Work on sweeping and lifting above head.  Able to do 20 #  with BIL arms to 90 degree shld flexion but cannot lift 5 lb with long lever arm wit Left arm only. Work on progressing strength so pt can lift 30# grandson, do housework including lifting heavy pots and pans and sweeping  Check all possible CPT codes: 9[place]- PT Re-evaluation, 97110- Therapeutic Exercise, [id number] Neuro Re-education, 97140 - Manual Therapy, 97530 - Therapeutic Activities, 97535 - Self Care, [id number]- Electrical stimulation (Manual), [id number]- Iontophoresis, and 97750 - Physical performance training     If treatment provided at initial evaluation, no treatment charged due to lack of authorization.    JHessie Diener PTA 05/14/22 8:45 AM Phone: [phone]: 3[place] PWilson [email or web address] Exercise Expert for the Aging Adult  07/05/22 8:28 AM Phone: [phone]: [phone]

## 2022-05-22 ENCOUNTER — Ambulatory Visit: Payer: 59 | Admitting: Physical Therapy

## 2022-05-29 ENCOUNTER — Ambulatory Visit: Payer: 59 | Admitting: Physical Therapy

## 2022-06-05 ENCOUNTER — Encounter: Payer: 59 | Admitting: Physical Therapy

## 2022-06-28 ENCOUNTER — Ambulatory Visit: Payer: 59 | Admitting: Physical Therapy

## 2022-07-05 ENCOUNTER — Ambulatory Visit: Payer: 59 | Admitting: Physical Therapy

## 2022-07-05 DIAGNOSIS — M6281 Muscle weakness (generalized): Secondary | ICD-10-CM | POA: Insufficient documentation

## 2022-07-05 DIAGNOSIS — M25512 Pain in left shoulder: Secondary | ICD-10-CM | POA: Insufficient documentation

## 2022-07-05 NOTE — Therapy (Signed)
OUTPATIENT PHYSICAL THERAPY TREATMENT NOTE/DISCHARGE NOTE PHYSICAL THERAPY DISCHARGE SUMMARY  Visits from Start of Care: 16 Pt entered 07-05-22 and said she did not need any PT or re evaluation. She has no pain and doing all chores without pain.  Pt visit cancelled and [place] for patient who does not need further PT Current functional level related to goals / functional outcomes: Pt not seen since 05-14-22 due to travelling in  [place]   Remaining deficits: None according to pt report   Education / Equipment: HEP/T band  Pt now has weights  to work with at home   Patient agrees to discharge. Patient goals were met. Patient is being discharged due to being pleased with the current functional level. And does not require further PT   Patient Name: Gabriella Norman MRN: [id number] DOB:1955/11/06, 66 y.o., female Today's Date: 07/05/2022 PCP: Scheryl Marten, [place]   REFERRING PROVIDER: Aundra Dubin, PA-C  END OF SESSION:   PT End of Session - 07/05/22 0821     Visit Number 16    Number of Visits 21    Date for PT Re-Evaluation 06/12/22    Authorization Type [employer]    PT Start Time 0804    PT Stop Time 0810    PT Time Calculation (min) 6 min                 Past Medical History:  Diagnosis Date   Asthma    DM (diabetes mellitus) ([place])    Hyperlipidemia    Hypertension    Past Surgical History:  Procedure Laterality Date   SHOULDER ARTHROSCOPY WITH ROTATOR CUFF REPAIR AND SUBACROMIAL DECOMPRESSION Left 02/09/2022   Procedure: LEFT SHOULDER ARTHROSCOPY, EXTENSIVE DEBRIDEMENT, SUBACROMIAL DECOMPRESSION;  Surgeon: Leandrew Koyanagi, MD;  Location: [facility];  Service: Orthopedics;  Laterality: Left;   TUBAL LIGATION     Patient Active Problem List   Diagnosis Date Noted   Superior labrum anterior-to-posterior (SLAP) tear of right shoulder 09/29/2021   Mild cognitive impairment 07/06/2021   Memory loss 04/03/2021   Pure hypercholesterolemia  04/23/2019   Type 2 diabetes mellitus without complication, without long-term current use of insulin ([place]) 04/23/2019   Benign essential HTN 04/17/2019   Cough variant asthma vs UACS 09/23/2018    REFERRING DIAG: Z98.890 (ICD-10-CM) - S/P arthroscopy of left shoulder   THERAPY DIAG:  Acute pain of left shoulder  Muscle weakness (generalized)  Rationale for Evaluation and Treatment Rehabilitation  PERTINENT HISTORY: Asthma, DM, Hyperlipidemia HTN. Mild cognitive impairment  Presently LEFT SHOULDER ARTHROSCOPY, EXTENSIVE DEBRIDEMENT, SUBACROMIAL DECOMPRESSION;  Superior labrum anterior-to-posterior (SLAP) tear of right shoulder   PRECAUTIONS: Shoulder No lifting, no resistance over biceps tenotomy PROM initially   SUBJECTIVE: Sweeping is a little better. I always have some pain in the top of my shoulder.   PAIN:  Are you having pain? Yes: NPRS scale: 3/10 Pain location: Left shoulder Pain description: sharp pain Aggravating factors: moving it.cant sleep on my arm, cannot pick up any items with arm Relieving factors: medication, utilizing pillow   OBJECTIVE: (objective measures completed at initial evaluation unless otherwise dated)   DIAGNOSTIC FINDINGS:  FINDINGS: 4-20-22There is no evidence of fracture or dislocation. There is no evidence of arthropathy or other focal bone abnormality. Soft tissues are unremarkable.   IMPRESSION: Negative.   PATIENT SURVEYS:  FOTO 32% eval  57% predicted FOTO status 03/27/22: 52% FOTO status 04-03-22 54% FOTO status 05-01-22  72%   COGNITION:  Overall cognitive status: Within functional limits for tasks assessed  mild cognitive impairment in history  with daughter for eval                                  SENSATION: WFL Light touch: Impaired  pt with some numbness into Left forearm    POSTURE: Forward head and rounded shoulders left more forward than Right   UPPER EXTREMITY ROM:    Active/ Passive ROM Right eval  Left eval Left 03/01/22 Left 03/05/22 Left  03/09/22 Left 03/12/22 Left 03/22/22 Left 03/27/22 Left 03-27-22 Left\ AROM 04/12/22 Left AROM 05/01/22 Left AROM  Shoulder flexion 160/P 165 50/P-90 P 100 P 125 A 95 P130 A117/ P134 P 140 P143 Pain A140 A158/P164 min pain A 155, min pain on lower  Shoulder extension 35              Shoulder abduction 157 50- 80 P 80 P90 A 55 P110 85  85 Pain A 135 A147/P153   Shoulder adduction                Shoulder internal rotation 90 P30       To L1 with pain L1 no pain T-12 no pain   Shoulder external rotation 60 P 0 P45  P60  P80 @ 45    T 2 no pain T -2 no pain   Elbow flexion                Elbow extension                Wrist flexion                Wrist extension                Wrist ulnar deviation                Wrist radial deviation                Wrist pronation                Wrist supination                (Blank rows = not tested)   UPPER EXTREMITY MMT:       Not tested due to post surgery Pt 5/5 on R   MMT Right eval Left eval Left 03-29-22 Left  04/17/22 Left 05-01-22  Shoulder flexion     4- 4- 4-  Shoulder extension     4-    Shoulder abduction     4- 4- 4-  Shoulder adduction         Shoulder internal rotation     3+ 4 4  Shoulder external rotation     4- 4 4  Middle trapezius         Lower trapezius         Elbow flexion         Elbow extension         Wrist flexion         Wrist extension         Wrist ulnar deviation         Wrist radial deviation         Wrist pronation         Wrist supination         Grip strength (lbs)         (  Blank rows = not tested)   SHOULDER SPECIAL TESTS:            NT due to recent shld arthroscopy biceps tenotomy and SAD   JOINT MOBILITY TESTING:  NT due to recent surgery   PALPATION:  TTP over incision and over Left biceps muscle belly             TODAY'S TREATMENT:  [facility] Adult PT Treatment:                                                DATE: 05/14/22 Therapeutic Exercise: UBE  level 2, 3 min each way Standing forward raise 3# 10 x 3  Standing lateral raise 3# 10 x 3  BIL OH press with 20 #  1 x 10 Bicep curl 6# 10 x 3  Green band ER bilat 10 x 3  Cabinet reach middle shelf 10 x 3 5# Blue band row 10 x 3  Green band extension 10 x 3     [facility] Adult PT Treatment:                                                DATE: 05-01-22 FOTO status 05-01-22  72% R eval Therapeutic Exercise: Shoulder Flexion Wall Slide with Towel  3 x 10 L only Standing Shoulder Abduction Wall Slide with Thumb Out 2 x 10 left only Seated Elbow Flexion with Dumbbells  5 # 3 x 10 Standing Shoulder Flexion with GTB 3 x 10 L only Standing Single Arm Shoulder Abduction  with GTB L only Shoulder External Rotation and Scapular Retraction  with GTB 3 x 10 BIL Scaption with GTB  3 x 10 reps 20# DB with both arms lift to shld height with compensations.   [facility] Adult PT Treatment:                                                DATE: 04-19-22 Therapeutic Exercise: UBE L2 2 min each way  BIL OH press with 20 #  1 x 10 BIl curl to OH press 20# 1 x 10 Star Pattern Green band x 10  Blue band tricep ext 10 x 3  Blue band bicep curl 10 x 3  Standing Blue band IR 10 x 3 5# OH Cabinet reach 10 x 3 top shelf R side lying ER  L  and towel and 4 lb weight  3 x 10 3 sec hold R side lying abduction  L 4 lb weight  3 x 10  R side lying flexion  L  4 lb weight  3 x 10      [facility] Adult PT Treatment:                                                DATE: 04-17-22 Therapeutic Exercise: UBE L2 2 min each way  BIL OH press with 20 #  1 x 10-(CGA on dumbell ) BIl  curl to [facility] press 15# 1 x 10 Star Pattern Green band x 10  Green band tricep ext 10 x 3  Green band bicep curl 10 x 3  R side lying ER  L  and towel and 4 lb weight  3 x 10 3 sec hold R side lying abduction  L 3 lb weight  3 x 10 3 sec hold R side lying flexion  L  3 lb weight  3 x 10 3 sec hold Standing Green band IR 10 x 3 4# OH Cabinet reach 10 x  3 top shelf    [facility] Adult PT Treatment:                                                DATE: 04-12-22 Therapeutic Exercise: UBE L1 2 min each way  R side lying ER  L  and towel and 3 lb weight  3 x 10 3 sec hold R side lying flexion  L  3 lb weight  3 x 10 3 sec hold R side lying abduction  L 3 lb weight  3 x 10 3 sec hold BIL OH press with 15 #  1 x 10 BIl curl to OH press 15# 1 x 10  Horizontal Abd 3 x 10 RTB Diagonals 3 x 10 GTB Needed TC and VC to perform correctly, left Green band tricep ext 10 x 3  Green band bicep curl 10 x 3              PATIENT EDUCATION: Education details: POC Explanation of findings issue HEP,  education on TPDN and precautions and aftercare with handout Person educated: Patient and Child(ren) Education method: Explanation, Demonstration, Tactile cues, Verbal cues, and Handouts Education comprehension: verbalized understanding, returned demonstration, verbal cues required, tactile cues required, and needs further education     HOME EXERCISE PROGRAM: Access Code: [email or web address]  modified 05-01-22 URL: [email or web address] Date: 05/01/2022 Prepared by: Voncille Lo  Exercises - Sidelying Shoulder ER with Towel and Dumbbell  - 1 x daily - 7 x weekly - 3 sets - 10 reps - Sidelying Shoulder Flexion 150 degrees  - 1 x daily - 7 x weekly - 3 sets - 10 reps - Sidelying Shoulder Abduction Palm Forward  - 1 x daily - 7 x weekly - 3 sets - 10 reps - Sidelying Shoulder Horizontal Abduction  - 1 x daily - 7 x weekly - 3 sets - 10 reps - Shoulder Flexion Wall Slide with Towel  - 1 x daily - 7 x weekly - 3 sets - 10 reps - Standing Shoulder Abduction Wall Slide with Thumb Out  - 1 x daily - 7 x weekly - 1-2 sets - 10 reps - Seated Elbow Flexion with Dumbbells  - 1 x daily - 7 x weekly - 3 sets - 10 reps - Standing Shoulder Flexion with Resistance  - 1 x daily - 7 x weekly - 3 sets - 10 reps - Standing Single Arm Shoulder Abduction with  Resistance  - 1 x daily - 7 x weekly - 3 sets - 10 reps - Shoulder External Rotation and Scapular Retraction with Resistance  - 1 x daily - 7 x weekly - 3 sets - 10 reps - Scaption with Resistance  - 1 x daily - 7 x weekly - 3 sets - 10 reps  Patient Education - Ice - Heat   ASSESSMENT:   CLINICAL IMPRESSION: Pt reports sweeping is a little better/easier and carrying heavy kitchen pots is easier as well. She sometimes has difficulty if pots are full. Continued with focused strengthening of left shoulder today. She has resting pain at 3/10 and tolerates strengthening with min increase in pain to 5/10. She is progressing well toward remaining goals reporting improvement in sweeping and carrying pots. Continued difficulty with long lever shoulder flexion, especially eccentric.      OBJECTIVE IMPAIRMENTS decreased ROM, decreased strength, impaired UE functional use, postural dysfunction, and pain.    ACTIVITY LIMITATIONS carrying, lifting, sleeping, bathing, toileting, dressing, reach over head, and caring for others not being able to lift 30 # grandson   PARTICIPATION LIMITATIONS:  cannot lift grandson and do housework   PERSONAL FACTORS Asthma, DM, Hyperlipidemia HTN. Mild cognitive impairment  are also affecting patient's functional outcome.    REHAB POTENTIAL: Good   CLINICAL DECISION MAKING: Evolving/moderate complexity   EVALUATION COMPLEXITY: Moderate     GOALS: Goals reviewed with patient? Yes   SHORT TERM GOALS: Target date: 03/27/2022  (Remove Blue Hyperlink)   Pt will be independent with initial HEP Baseline:noknowledge Goal status: MET 03/09/22   2.  Pt will gain PROM in Left shld flexion to at least 140 degrees Baseline: eval 90 degrees PROM Status 03/22/22: 134 flexion PROM Status: 03/27/22 140 flexion PROM Goal status: MET   3.  Pt will be able to sleep throughout night for at least 4 hours without waking utilizing pillows and positioning for  left shld  comform Baseline: wakes at least 3 times a night and cannot sleep on left side 03-29-22  6hours of sleep Status: 03/22/22: wakes 2 x per night due to pain  Goal status: MET       LONG TERM GOALS: Target date: 04/24/2022   Revised  for target date 06-12-22   Pt will be independent with advanced HEP Baseline: No knowledge 03-29-22 working on advanced HEP.05-01-22  Pt compensates with elevated upper trap to carry greater than 5 lb and not full AROM Goal status: ONGOING   2.  Pt will be able to hold grandson who is 30 # and carry without exacerbating pain in Left shoulder Baseline: Unable to lift any object or have any resistance at eval 04-10-22  Pt able to left 15 # BIL above head Status 04/19/22: can lift 20# over head - no longer needs to pick up grandson 05-01-22  unable to pick up grandson but can carry if someone picks him up for her Goal status: ONGOING   3.  Pt will be able to sleep on Left side without waking during the night for at least 4-5 hours of uninterrupted sleep. Baseline: Pt wakes at least 3 time a night due to pain in left shld /unable to lie of left shld Status: 03/27/22 :pain wakes 2 x per night Status: 04/19/22: no longer wakes up- but does not sleep on L Goal status: Partially MET   4.  Pt will be able to perform household chores like sweeping the floor and cooking  with pain 2/10 or less Baseline: unable to perform any household chores,   04-10-22  Pt able to cook but does not sweep with Left hand.   04/19/22: Has not tried sweeping 05-01-22 tried sweeping but  still difficult picking up heavy cooking pans Goal status: ONGOING   5.  Pt will be able to perform prayers in childs pose  without exacerbating pain in arms Baseline: unable to pray in Muslim prayer posture due to arm pain and decreased AROM Goal status: MET 04-10-22   6.  FOTO will improve from 32%   to  57%     indicating improved functional mobility . Baseline: eval 32% Status: 03/27/22 (8th visit) : 54% Status  05-01-22  72% Goal status: MET   7. Pt will be able to lift dishes above head to put in cabinet in order to complete kitchen work.            Baseline:  unable to lift left UE 04-10-22  Pt able to lift BIL OH press with 15 #  Status: 04/19/22: has not tried  Status 05-01-22 must use both hands to place 5 lb in shelf at shoulder height            GOAL status ONGOING     PLAN: PT FREQUENCY: 2x/week   PT DURATION: 8 weeks   PLANNED INTERVENTIONS: Therapeutic exercises, Therapeutic activity, Neuromuscular re-education, Patient/Family education, Joint mobilization, Dry Needling, Electrical stimulation, Cryotherapy, Moist heat, Taping, Ionotophoresis 31m/ml Dexamethasone, Manual therapy, and Re-evaluation   PLAN FOR NEXT SESSION:   Work on sweeping and lifting above head.  Able to do 20 #  with BIL arms to 90 degree shld flexion but cannot lift 5 lb with long lever arm wit Left arm only. Work on progressing strength so pt can lift 30# grandson, do housework including lifting heavy pots and pans and sweeping  Check all possible CPT codes: [place]- PT Re-evaluation, 97110- Therapeutic Exercise, [id number] Neuro Re-education, 97140 - Manual Therapy, 97530 - Therapeutic Activities, 97535 - Self Care, [phone]- Electrical stimulation (Manual), [id number]- Iontophoresis, and 97750 - Physical performance training     If treatment provided at initial evaluation, no treatment charged due to lack of authorization.    JHessie Diener PTA 07/05/22 8:22 AM Phone: [phone]: 3[place] PCozad AVersaillesCertified Exercise Expert for the Aging Adult  07/05/22 8:22 AM Phone: [phone]: [phone]

## 2022-11-23 ENCOUNTER — Other Ambulatory Visit: Payer: Self-pay | Admitting: Internal Medicine

## 2022-11-23 DIAGNOSIS — Z13820 Encounter for screening for osteoporosis: Secondary | ICD-10-CM

## 2023-01-09 ENCOUNTER — Encounter (INDEPENDENT_AMBULATORY_CARE_PROVIDER_SITE_OTHER): Payer: 59 | Admitting: Ophthalmology

## 2023-01-09 DIAGNOSIS — I10 Essential (primary) hypertension: Secondary | ICD-10-CM

## 2023-01-09 DIAGNOSIS — H4323 Crystalline deposits in vitreous body, bilateral: Secondary | ICD-10-CM

## 2023-01-09 DIAGNOSIS — H25813 Combined forms of age-related cataract, bilateral: Secondary | ICD-10-CM

## 2023-01-09 DIAGNOSIS — H35033 Hypertensive retinopathy, bilateral: Secondary | ICD-10-CM

## 2023-01-09 DIAGNOSIS — E119 Type 2 diabetes mellitus without complications: Secondary | ICD-10-CM

## 2023-02-07 DIAGNOSIS — I10 Essential (primary) hypertension: Secondary | ICD-10-CM | POA: Diagnosis not present

## 2023-02-07 DIAGNOSIS — E1165 Type 2 diabetes mellitus with hyperglycemia: Secondary | ICD-10-CM | POA: Diagnosis not present

## 2023-02-15 ENCOUNTER — Other Ambulatory Visit: Payer: Self-pay | Admitting: Internal Medicine

## 2023-02-15 DIAGNOSIS — Z1231 Encounter for screening mammogram for malignant neoplasm of breast: Secondary | ICD-10-CM

## 2023-03-06 ENCOUNTER — Ambulatory Visit
Admission: RE | Admit: 2023-03-06 | Discharge: 2023-03-06 | Disposition: A | Discharge status: Home / Self Care | Payer: Medicare Other | Source: Ambulatory Visit | Attending: Internal Medicine | Admitting: Internal Medicine

## 2023-03-06 DIAGNOSIS — Z1231 Encounter for screening mammogram for malignant neoplasm of breast: Secondary | ICD-10-CM | POA: Diagnosis not present

## 2023-04-26 DIAGNOSIS — R42 Dizziness and giddiness: Secondary | ICD-10-CM | POA: Diagnosis not present

## 2023-04-26 DIAGNOSIS — Z789 Other specified health status: Secondary | ICD-10-CM | POA: Diagnosis not present

## 2023-04-26 DIAGNOSIS — R112 Nausea with vomiting, unspecified: Secondary | ICD-10-CM | POA: Diagnosis not present

## 2023-04-26 DIAGNOSIS — Z03818 Encounter for observation for suspected exposure to other biological agents ruled out: Secondary | ICD-10-CM | POA: Diagnosis not present

## 2023-04-30 DIAGNOSIS — E1151 Type 2 diabetes mellitus with diabetic peripheral angiopathy without gangrene: Secondary | ICD-10-CM | POA: Diagnosis not present

## 2023-04-30 DIAGNOSIS — E1165 Type 2 diabetes mellitus with hyperglycemia: Secondary | ICD-10-CM | POA: Diagnosis not present

## 2023-04-30 DIAGNOSIS — R42 Dizziness and giddiness: Secondary | ICD-10-CM | POA: Diagnosis not present

## 2023-05-21 ENCOUNTER — Ambulatory Visit
Admission: RE | Admit: 2023-05-21 | Discharge: 2023-05-21 | Disposition: A | Discharge status: Home / Self Care | Payer: Medicare Other | Source: Ambulatory Visit | Attending: Internal Medicine | Admitting: Internal Medicine

## 2023-05-21 DIAGNOSIS — Z13820 Encounter for screening for osteoporosis: Secondary | ICD-10-CM

## 2023-05-21 DIAGNOSIS — M8588 Other specified disorders of bone density and structure, other site: Secondary | ICD-10-CM | POA: Diagnosis not present

## 2023-05-21 DIAGNOSIS — E349 Endocrine disorder, unspecified: Secondary | ICD-10-CM | POA: Diagnosis not present

## 2023-06-27 DIAGNOSIS — Z23 Encounter for immunization: Secondary | ICD-10-CM | POA: Diagnosis not present

## 2023-09-02 DIAGNOSIS — H35039 Hypertensive retinopathy, unspecified eye: Secondary | ICD-10-CM | POA: Insufficient documentation

## 2023-09-02 DIAGNOSIS — S60414A Abrasion of right ring finger, initial encounter: Secondary | ICD-10-CM | POA: Diagnosis not present

## 2023-09-02 DIAGNOSIS — E1165 Type 2 diabetes mellitus with hyperglycemia: Secondary | ICD-10-CM | POA: Insufficient documentation

## 2023-09-02 DIAGNOSIS — Z23 Encounter for immunization: Secondary | ICD-10-CM | POA: Diagnosis not present

## 2023-09-02 DIAGNOSIS — L4050 Arthropathic psoriasis, unspecified: Secondary | ICD-10-CM | POA: Insufficient documentation

## 2023-09-02 DIAGNOSIS — K219 Gastro-esophageal reflux disease without esophagitis: Secondary | ICD-10-CM | POA: Insufficient documentation

## 2023-09-02 DIAGNOSIS — I10 Essential (primary) hypertension: Secondary | ICD-10-CM | POA: Insufficient documentation

## 2023-09-02 DIAGNOSIS — W5503XA Scratched by cat, initial encounter: Secondary | ICD-10-CM | POA: Diagnosis not present

## 2023-12-09 ENCOUNTER — Encounter (INDEPENDENT_AMBULATORY_CARE_PROVIDER_SITE_OTHER): Payer: 59 | Admitting: Ophthalmology

## 2023-12-09 DIAGNOSIS — I10 Essential (primary) hypertension: Secondary | ICD-10-CM

## 2023-12-09 DIAGNOSIS — H35033 Hypertensive retinopathy, bilateral: Secondary | ICD-10-CM

## 2023-12-09 DIAGNOSIS — E119 Type 2 diabetes mellitus without complications: Secondary | ICD-10-CM

## 2023-12-09 DIAGNOSIS — H4323 Crystalline deposits in vitreous body, bilateral: Secondary | ICD-10-CM

## 2023-12-09 DIAGNOSIS — H25813 Combined forms of age-related cataract, bilateral: Secondary | ICD-10-CM

## 2024-01-16 DIAGNOSIS — R051 Acute cough: Secondary | ICD-10-CM | POA: Diagnosis not present

## 2024-01-16 DIAGNOSIS — J0100 Acute maxillary sinusitis, unspecified: Secondary | ICD-10-CM | POA: Diagnosis not present

## 2024-01-16 DIAGNOSIS — K219 Gastro-esophageal reflux disease without esophagitis: Secondary | ICD-10-CM | POA: Diagnosis not present

## 2024-02-12 ENCOUNTER — Other Ambulatory Visit: Payer: Self-pay | Admitting: Internal Medicine

## 2024-02-12 DIAGNOSIS — Z1231 Encounter for screening mammogram for malignant neoplasm of breast: Secondary | ICD-10-CM

## 2024-02-19 DIAGNOSIS — I10 Essential (primary) hypertension: Secondary | ICD-10-CM | POA: Diagnosis not present

## 2024-02-19 DIAGNOSIS — R052 Subacute cough: Secondary | ICD-10-CM | POA: Diagnosis not present

## 2024-02-19 DIAGNOSIS — E1165 Type 2 diabetes mellitus with hyperglycemia: Secondary | ICD-10-CM | POA: Diagnosis not present

## 2024-02-19 DIAGNOSIS — R059 Cough, unspecified: Secondary | ICD-10-CM | POA: Diagnosis not present

## 2024-03-06 ENCOUNTER — Ambulatory Visit

## 2024-03-09 ENCOUNTER — Ambulatory Visit

## 2024-03-10 ENCOUNTER — Ambulatory Visit
Admission: RE | Admit: 2024-03-10 | Discharge: 2024-03-10 | Disposition: A | Discharge status: Home / Self Care | Source: Ambulatory Visit | Attending: Internal Medicine | Admitting: Internal Medicine

## 2024-03-10 DIAGNOSIS — Z1231 Encounter for screening mammogram for malignant neoplasm of breast: Secondary | ICD-10-CM

## 2024-03-16 DIAGNOSIS — E1165 Type 2 diabetes mellitus with hyperglycemia: Secondary | ICD-10-CM | POA: Diagnosis not present

## 2024-03-16 DIAGNOSIS — E1151 Type 2 diabetes mellitus with diabetic peripheral angiopathy without gangrene: Secondary | ICD-10-CM | POA: Diagnosis not present

## 2024-03-16 DIAGNOSIS — H35033 Hypertensive retinopathy, bilateral: Secondary | ICD-10-CM | POA: Diagnosis not present

## 2024-03-16 DIAGNOSIS — L4050 Arthropathic psoriasis, unspecified: Secondary | ICD-10-CM | POA: Diagnosis not present

## 2024-04-16 DIAGNOSIS — L4050 Arthropathic psoriasis, unspecified: Secondary | ICD-10-CM | POA: Diagnosis not present

## 2024-04-16 DIAGNOSIS — E1151 Type 2 diabetes mellitus with diabetic peripheral angiopathy without gangrene: Secondary | ICD-10-CM | POA: Diagnosis not present

## 2024-04-16 DIAGNOSIS — H35033 Hypertensive retinopathy, bilateral: Secondary | ICD-10-CM | POA: Diagnosis not present

## 2024-04-16 DIAGNOSIS — E1165 Type 2 diabetes mellitus with hyperglycemia: Secondary | ICD-10-CM | POA: Diagnosis not present

## 2024-05-17 DIAGNOSIS — E1165 Type 2 diabetes mellitus with hyperglycemia: Secondary | ICD-10-CM | POA: Diagnosis not present

## 2024-05-17 DIAGNOSIS — H35033 Hypertensive retinopathy, bilateral: Secondary | ICD-10-CM | POA: Diagnosis not present

## 2024-05-17 DIAGNOSIS — E1151 Type 2 diabetes mellitus with diabetic peripheral angiopathy without gangrene: Secondary | ICD-10-CM | POA: Diagnosis not present

## 2024-05-17 DIAGNOSIS — L4050 Arthropathic psoriasis, unspecified: Secondary | ICD-10-CM | POA: Diagnosis not present

## 2024-06-04 ENCOUNTER — Ambulatory Visit (INDEPENDENT_AMBULATORY_CARE_PROVIDER_SITE_OTHER)

## 2024-06-04 ENCOUNTER — Ambulatory Visit
Admission: EM | Admit: 2024-06-04 | Discharge: 2024-06-04 | Disposition: A | Discharge status: Home / Self Care | Attending: Family Medicine | Admitting: Family Medicine

## 2024-06-04 DIAGNOSIS — J069 Acute upper respiratory infection, unspecified: Secondary | ICD-10-CM

## 2024-06-04 DIAGNOSIS — I771 Stricture of artery: Secondary | ICD-10-CM | POA: Diagnosis not present

## 2024-06-04 DIAGNOSIS — R051 Acute cough: Secondary | ICD-10-CM

## 2024-06-04 DIAGNOSIS — J4521 Mild intermittent asthma with (acute) exacerbation: Secondary | ICD-10-CM

## 2024-06-04 DIAGNOSIS — E1165 Type 2 diabetes mellitus with hyperglycemia: Secondary | ICD-10-CM | POA: Insufficient documentation

## 2024-06-04 DIAGNOSIS — R059 Cough, unspecified: Secondary | ICD-10-CM | POA: Diagnosis not present

## 2024-06-04 DIAGNOSIS — I739 Peripheral vascular disease, unspecified: Secondary | ICD-10-CM | POA: Insufficient documentation

## 2024-06-04 DIAGNOSIS — I700 Atherosclerosis of aorta: Secondary | ICD-10-CM | POA: Diagnosis not present

## 2024-06-04 DIAGNOSIS — E1151 Type 2 diabetes mellitus with diabetic peripheral angiopathy without gangrene: Secondary | ICD-10-CM | POA: Insufficient documentation

## 2024-06-04 LAB — POC COVID19/FLU A&B COMBO
Covid Antigen, POC: NEGATIVE
Influenza A Antigen, POC: NEGATIVE
Influenza B Antigen, POC: NEGATIVE

## 2024-06-04 MED ORDER — PREDNISONE 20 MG PO TABS: 40.0000 mg | ORAL_TABLET | Freq: Every day | ORAL | 0 refills | Status: AC

## 2024-06-04 MED ORDER — ALBUTEROL SULFATE HFA 108 (90 BASE) MCG/ACT IN AERS: 2.0000 | INHALATION_SPRAY | RESPIRATORY_TRACT | 0 refills | Status: AC | PRN

## 2024-06-04 MED ORDER — BENZONATATE 100 MG PO CAPS: 100.0000 mg | ORAL_CAPSULE | Freq: Three times a day (TID) | ORAL | 0 refills | Status: AC | PRN

## 2024-06-04 MED ORDER — PROMETHAZINE-DM 6.25-15 MG/5ML PO SYRP: 5.0000 mL | ORAL_SOLUTION | Freq: Four times a day (QID) | ORAL | 0 refills | Status: AC | PRN

## 2024-06-04 NOTE — ED Triage Notes (Signed)
 Here with Spouse. Patient reports a Cough for 3 days, not allowing any sleep at night. No runny nose. No fever. No recent Travel. No seasonal Flu or COVID19 vaccines yet. Current PCP.

## 2024-06-04 NOTE — ED Provider Notes (Signed)
 [facility]    CSN: [id number] Arrival date & time: 06/04/24  1408      History   Chief Complaint Chief Complaint  Patient presents with   Cough    HPI Gabriella Norman is a 68 y.o. female.    Cough Here for a dry cough is been going on for about 3 days, since September 15.  It is keeping her from sleeping much at night.  No congestion and no fever.  No sore throat.  She feels tired but feels that is due to not sleeping  She has apparently been recently told that she might have some mild asthma.   No nausea vomiting or diarrhea. SABRA NKDA  She does have diabetes but is not checking her sugars.  Last A1c was 7.9 in January of this year  Her last eGFR was 46 in January of this year.  Those lab values are found in care everywhere  Past Medical History:  Diagnosis Date   Asthma    DM (diabetes mellitus) (HCC)    Hyperlipidemia    Hypertension     Patient Active Problem List   Diagnosis Date Noted   Peripheral angiopathy due to type 2 diabetes mellitus (HCC) 06/04/2024   Hyperglycemia due to type 2 diabetes mellitus (HCC) 06/04/2024   Peripheral vascular disease (HCC) 06/04/2024   Gastroesophageal reflux disease without esophagitis 09/02/2023   Hypertensive retinopathy 09/02/2023   Psoriatic arthritis (HCC) 09/02/2023   Type 2 diabetes mellitus with hyperglycemia, without long-term current use of insulin (HCC) 09/02/2023   Primary hypertension 09/02/2023   Superior labrum anterior-to-posterior (SLAP) tear of right shoulder 09/29/2021   Mild cognitive impairment 07/06/2021   Memory loss 04/03/2021   Hypokalemia 11/18/2019   Pure hypercholesterolemia 04/23/2019   Type 2 diabetes mellitus without complication, without long-term current use of insulin (HCC) 04/23/2019   Benign essential HTN 04/17/2019   Cough variant asthma 09/23/2018    Past Surgical History:  Procedure Laterality Date   SHOULDER ARTHROSCOPY WITH ROTATOR CUFF REPAIR AND SUBACROMIAL  DECOMPRESSION Left 02/09/2022   Procedure: LEFT SHOULDER ARTHROSCOPY, EXTENSIVE DEBRIDEMENT, SUBACROMIAL DECOMPRESSION;  Surgeon: Jerri Kay HERO, MD;  Location: [facility];  Service: Orthopedics;  Laterality: Left;   TUBAL LIGATION      OB History   No obstetric history on file.      Home Medications    Prior to Admission medications   Medication Sig Start Date End Date Taking? Authorizing Provider  amoxicillin-clavulanate (AUGMENTIN) 875-125 MG tablet Take by mouth 2 (two) times daily. 01/16/24  Yes [provider]  benzonatate  (TESSALON ) 100 MG capsule Take 1 capsule (100 mg total) by mouth 3 (three) times daily as needed for cough. 06/04/24  Yes Vonna Sharlet POUR, MD  predniSONE  (DELTASONE ) 20 MG tablet Take 2 tablets (40 mg total) by mouth daily with breakfast for 3 days. 06/04/24 06/07/24 Yes Vonna Sharlet POUR, MD  promethazine -dextromethorphan (PROMETHAZINE -DM) 6.25-15 MG/5ML syrup Take 5 mLs by mouth 4 (four) times daily as needed for cough. 06/04/24  Yes Vonna Sharlet POUR, MD  albuterol  (VENTOLIN  HFA) 108 (90 Base) MCG/ACT inhaler Inhale 2 puffs into the lungs every 4 (four) hours as needed for wheezing or shortness of breath. 06/04/24   Layden Caterino K, MD  atenolol-chlorthalidone (TENORETIC) 50-25 MG tablet Take 1 tablet by mouth daily. 05/20/20   [provider]  Blood Glucose Monitoring Suppl (ACCU-CHEK AVIVA PLUS) w/Device KIT glucometer to check fasting blood sugar with strips and lancet in vitro once a  day for 30 days 04/30/23   [provider]  cetirizine (ZYRTEC) 10 MG tablet Take 10 mg by mouth daily. 04/30/23   [provider]  cholecalciferol (VITAMIN D3) 25 MCG (1000 UNIT) tablet Take 1,000 Units by mouth daily.    [provider]  famotidine  (PEPCID ) 20 MG tablet One at bedtime Patient not taking: Reported on 02/27/2022 09/23/18   Darlean Ozell NOVAK, MD  glipiZIDE (GLUCOTROL) 10 MG tablet Take 10 mg by mouth daily before  breakfast. 02/07/23   [provider]  HYDROcodone -acetaminophen  (NORCO) 5-325 MG tablet Take 1 tablet by mouth 3 (three) times daily as needed. 02/16/22   Jule Ronal CROME, PA-C  losartan-hydrochlorothiazide (HYZAAR) 100-25 MG tablet Take 1 tablet by mouth daily.    [provider]  losartan-hydrochlorothiazide (HYZAAR) 50-12.5 MG tablet 1 (one) time each day at the same time.    [provider]  meclizine (ANTIVERT) 12.5 MG tablet Take 12.5 mg by mouth every 6 (six) hours as needed. 04/26/23   [provider]  meclizine (ANTIVERT) 25 MG tablet Take 25 mg by mouth every 6 (six) hours as needed.    [provider]  metFORMIN (GLUCOPHAGE) 1000 MG tablet Take 1,000 mg by mouth 2 (two) times daily.    [provider]  metFORMIN (GLUCOPHAGE) 500 MG tablet TAKE 1 TABLET BY MOUTH TWICE DAILY FOR 90 DAYS 07/17/18   [provider]  metFORMIN (GLUCOPHAGE) 500 MG tablet Take 1 tablet by mouth daily with breakfast. 07/25/20   [provider]  ondansetron  (ZOFRAN ) 4 MG tablet Take 4 mg by mouth every 6 (six) hours as needed for nausea. 04/26/23   [provider]  pantoprazole  (PROTONIX ) 40 MG tablet TAKE 1 TABLET BY MOUTH ONCE DAILY(TAKE 30-60 MINUTES BEFORE FIRST MEAL OF THE DAY) 01/27/19   Darlean Ozell NOVAK, MD  pantoprazole  (PROTONIX ) 40 MG tablet Take 40 mg by mouth daily. 05/20/20   [provider]  pantoprazole  (PROTONIX ) 40 MG tablet Take 40 mg by mouth daily.    [provider]  potassium chloride (KLOR-CON) 20 MEQ packet Take 20 mEq by mouth 2 (two) times daily. 11/18/19   [provider]  pregabalin (LYRICA) 50 MG capsule Take 50 mg by mouth 2 (two) times daily. 05/20/20   [provider]  rosuvastatin (CRESTOR) 20 MG tablet Take 20 mg by mouth daily.    [provider]  rosuvastatin (CRESTOR) 20 MG tablet Take 20 mg by mouth daily.    [provider]    Family History History  reviewed. No pertinent family history.  Social History Social History   Tobacco Use   Smoking status: Never   Smokeless tobacco: Never  Vaping Use   Vaping status: Never Used  Substance Use Topics   Alcohol use: Never   Drug use: Never     Allergies   Patient has no known allergies.   Review of Systems Review of Systems  Respiratory:  Positive for cough.      Physical Exam Triage Vital Signs ED Triage Vitals  Encounter Vitals Group     BP 06/04/24 1450 (!) 155/81     Girls Systolic BP Percentile --      Girls Diastolic BP Percentile --      Boys Systolic BP Percentile --      Boys Diastolic BP Percentile --      Pulse Rate 06/04/24 1450 82     Resp 06/04/24 1450 20     Temp  06/04/24 1450 97.9 F (36.6 C)     Temp Source 06/04/24 1450 Oral     SpO2 06/04/24 1450 97 %     Weight 06/04/24 1447 191 lb 5.8 oz (86.8 kg)     Height 06/04/24 1447 5' 6 (1.676 m)     Head Circumference --      Peak Flow --      Pain Score 06/04/24 1445 0     Pain Loc --      Pain Education --      Exclude from Growth Chart --    No data found.  Updated Vital Signs BP (!) 155/81 (BP Location: Right Arm)   Pulse 82   Temp 97.9 F (36.6 C) (Oral)   Resp 20   Ht 5' 6 (1.676 m)   Wt 86.8 kg   SpO2 97%   BMI 30.89 kg/m   Visual Acuity Right Eye Distance:   Left Eye Distance:   Bilateral Distance:    Right Eye Near:   Left Eye Near:    Bilateral Near:     Physical Exam Vitals reviewed.  Constitutional:      General: She is not in acute distress.    Appearance: She is not ill-appearing, toxic-appearing or diaphoretic.  HENT:     Right Ear: Tympanic membrane and ear canal normal.     Left Ear: Tympanic membrane and ear canal normal.     Nose: Nose normal.     Mouth/Throat:     Mouth: Mucous membranes are moist.     Pharynx: No oropharyngeal exudate or posterior oropharyngeal erythema.  Eyes:     Extraocular Movements: Extraocular movements intact.      Conjunctiva/sclera: Conjunctivae normal.     Pupils: Pupils are equal, round, and reactive to light.  Cardiovascular:     Rate and Rhythm: Normal rate and regular rhythm.     Heart sounds: No murmur heard. Pulmonary:     Effort: No respiratory distress.     Breath sounds: No stridor. No rhonchi or rales.     Comments: Air movement is good.  Breath sounds are little coarse. No wheezing heard at the time of exam Musculoskeletal:     Cervical back: Neck supple.  Lymphadenopathy:     Cervical: No cervical adenopathy.  Skin:    Capillary Refill: Capillary refill takes less than 2 seconds.     Coloration: Skin is not jaundiced or pale.  Neurological:     General: No focal deficit present.     Mental Status: She is alert and oriented to person, place, and time.  Psychiatric:        Behavior: Behavior normal.      UC Treatments / Results  Labs (all labs ordered are listed, but only abnormal results are displayed) Labs Reviewed  POC COVID19/FLU A&B COMBO - Normal    EKG   Radiology DG Chest 2 View Result Date: 06/04/2024 CLINICAL DATA:  cough x 3 days EXAM: CHEST - 2 VIEW COMPARISON:  02/19/2024 FINDINGS: No focal airspace consolidation, pleural effusion, or pneumothorax. No cardiomegaly. Tortuous aorta with aortic atherosclerosis. No acute fracture or destructive lesions. Multilevel thoracic osteophytosis. Bilateral AC joint osteoarthritis. IMPRESSION: No acute cardiopulmonary abnormality. Electronically Signed   By: Rogelia Myers M.D.   On: 06/04/2024 15:26    Procedures Procedures (including critical care time)  Medications Ordered in UC Medications - No data to display  Initial Impression / Assessment and Plan / UC Course  I have reviewed  the triage vital signs and the nursing notes.  Pertinent labs & imaging results that were available during my care of the patient were reviewed by me and considered in my medical decision making (see chart for details).      Chest  x-ray is clear  Flu and COVID tests are negative.  Tessalon  Perles are sent in for the cough along with 3 days of prednisone  for what could be an asthma exacerbation. Final Clinical Impressions(s) / UC Diagnoses   Final diagnoses:  Acute cough     Discharge Instructions      The tests for flu and COVID were negative  The chest x-ray was read as negative and did not show any pneumonia.  Take benzonatate  100 mg, 1 tab every 8 hours as needed for cough. Take Phenergan  with dextromethorphan syrup--5 mL or 1 teaspoon every 6 hours as needed for cough  Take prednisone  20 mg--2 daily for 3 days; this is for possible inflammation in your lungs  Albuterol  inhaler--do 2 puffs every 4 hours as needed for shortness of breath or wheezing; I sent a new prescription for an inhaler in case you needed it.      ED Prescriptions     Medication Sig Dispense Auth. Provider   albuterol  (VENTOLIN  HFA) 108 (90 Base) MCG/ACT inhaler Inhale 2 puffs into the lungs every 4 (four) hours as needed for wheezing or shortness of breath. 1 each Vonna Sharlet POUR, MD   predniSONE  (DELTASONE ) 20 MG tablet Take 2 tablets (40 mg total) by mouth daily with breakfast for 3 days. 6 tablet Delrose Rohwer, Sharlet POUR, MD   benzonatate  (TESSALON ) 100 MG capsule Take 1 capsule (100 mg total) by mouth 3 (three) times daily as needed for cough. 21 capsule Vonna Sharlet POUR, MD   promethazine -dextromethorphan (PROMETHAZINE -DM) 6.25-15 MG/5ML syrup Take 5 mLs by mouth 4 (four) times daily as needed for cough. 118 mL Vonna Sharlet POUR, MD      PDMP not reviewed this encounter.   Vonna Sharlet POUR, MD 06/04/24 [phone]

## 2024-06-04 NOTE — Discharge Instructions (Signed)
 The tests for flu and COVID were negative  The chest x-ray was read as negative and did not show any pneumonia.  Take benzonatate  100 mg, 1 tab every 8 hours as needed for cough. Take Phenergan  with dextromethorphan syrup--5 mL or 1 teaspoon every 6 hours as needed for cough  Take prednisone  20 mg--2 daily for 3 days; this is for possible inflammation in your lungs  Albuterol  inhaler--do 2 puffs every 4 hours as needed for shortness of breath or wheezing; I sent a new prescription for an inhaler in case you needed it.

## 2024-06-16 DIAGNOSIS — E1151 Type 2 diabetes mellitus with diabetic peripheral angiopathy without gangrene: Secondary | ICD-10-CM | POA: Diagnosis not present

## 2024-06-16 DIAGNOSIS — E1165 Type 2 diabetes mellitus with hyperglycemia: Secondary | ICD-10-CM | POA: Diagnosis not present

## 2024-06-16 DIAGNOSIS — L4050 Arthropathic psoriasis, unspecified: Secondary | ICD-10-CM | POA: Diagnosis not present

## 2024-06-16 DIAGNOSIS — H35033 Hypertensive retinopathy, bilateral: Secondary | ICD-10-CM | POA: Diagnosis not present

## 2024-07-02 DIAGNOSIS — Z1159 Encounter for screening for other viral diseases: Secondary | ICD-10-CM | POA: Diagnosis not present

## 2024-07-02 DIAGNOSIS — I10 Essential (primary) hypertension: Secondary | ICD-10-CM | POA: Diagnosis not present

## 2024-07-02 DIAGNOSIS — Z1321 Encounter for screening for nutritional disorder: Secondary | ICD-10-CM | POA: Diagnosis not present

## 2024-07-02 DIAGNOSIS — W108XXA Fall (on) (from) other stairs and steps, initial encounter: Secondary | ICD-10-CM | POA: Diagnosis not present

## 2024-07-02 DIAGNOSIS — E119 Type 2 diabetes mellitus without complications: Secondary | ICD-10-CM | POA: Diagnosis not present

## 2024-07-02 DIAGNOSIS — E785 Hyperlipidemia, unspecified: Secondary | ICD-10-CM | POA: Diagnosis not present

## 2024-07-02 DIAGNOSIS — Z0000 Encounter for general adult medical examination without abnormal findings: Secondary | ICD-10-CM | POA: Diagnosis not present

## 2024-07-02 DIAGNOSIS — Z23 Encounter for immunization: Secondary | ICD-10-CM | POA: Diagnosis not present

## 2024-07-02 DIAGNOSIS — Z79899 Other long term (current) drug therapy: Secondary | ICD-10-CM | POA: Diagnosis not present

## 2024-07-08 NOTE — Progress Notes (Addendum)
 Gabriella Norman                                          MRN: [id number]   10/16/2024   The [facility] Quality Team Specialist reviewed this patient medical record for the purposes of chart review for care gap closure. The following were reviewed: chart review for care gap closure-diabetic eye exam.    [facility] Quality Team

## 2024-07-16 ENCOUNTER — Encounter: Payer: Self-pay | Admitting: Adult Health Nurse Practitioner

## 2024-07-17 ENCOUNTER — Other Ambulatory Visit: Payer: Self-pay | Admitting: Adult Health Nurse Practitioner

## 2024-07-17 DIAGNOSIS — Z1231 Encounter for screening mammogram for malignant neoplasm of breast: Secondary | ICD-10-CM

## 2024-08-03 ENCOUNTER — Ambulatory Visit

## 2024-08-27 ENCOUNTER — Ambulatory Visit
